# Patient Record
Sex: Male | Born: 1955
Health system: Southern US, Community
[De-identification: ages and names within clinical notes are randomized; demographics above are authoritative.]

## PROBLEM LIST (undated history)

## (undated) DIAGNOSIS — E039 Hypothyroidism, unspecified: Secondary | ICD-10-CM

## (undated) DIAGNOSIS — I10 Essential (primary) hypertension: Secondary | ICD-10-CM

## (undated) DIAGNOSIS — N200 Calculus of kidney: Secondary | ICD-10-CM

## (undated) DIAGNOSIS — R51 Headache: Secondary | ICD-10-CM

## (undated) DIAGNOSIS — G238 Other specified degenerative diseases of basal ganglia: Secondary | ICD-10-CM

## (undated) DIAGNOSIS — G63 Polyneuropathy in diseases classified elsewhere: Secondary | ICD-10-CM

## (undated) HISTORY — PX: MANDIBLE FRACTURE SURGERY: SHX706

## (undated) HISTORY — PX: CERVICAL SPINE SURGERY: SHX589

---

## 1980-08-07 HISTORY — PX: NASAL SINUS SURGERY: SHX719

## 1998-04-13 ENCOUNTER — Encounter (HOSPITAL_COMMUNITY): Admission: RE | Admit: 1998-04-13 | Discharge: 1998-07-12 | Payer: Self-pay | Admitting: *Deleted

## 1998-10-17 ENCOUNTER — Ambulatory Visit (HOSPITAL_COMMUNITY): Admission: RE | Admit: 1998-10-17 | Discharge: 1998-10-17 | Payer: Self-pay | Admitting: Neurosurgery

## 1998-10-17 ENCOUNTER — Encounter: Payer: Self-pay | Admitting: Neurosurgery

## 1999-10-28 ENCOUNTER — Encounter: Payer: Self-pay | Admitting: Neurosurgery

## 1999-10-28 ENCOUNTER — Ambulatory Visit (HOSPITAL_COMMUNITY): Admission: RE | Admit: 1999-10-28 | Discharge: 1999-10-28 | Payer: Self-pay | Admitting: Neurosurgery

## 2000-10-04 ENCOUNTER — Encounter: Payer: Self-pay | Admitting: Allergy and Immunology

## 2000-10-04 ENCOUNTER — Encounter: Admission: RE | Admit: 2000-10-04 | Discharge: 2000-10-04 | Payer: Self-pay | Admitting: *Deleted

## 2008-03-10 ENCOUNTER — Emergency Department (HOSPITAL_COMMUNITY): Admission: EM | Admit: 2008-03-10 | Discharge: 2008-03-10 | Payer: Self-pay | Admitting: Emergency Medicine

## 2008-03-10 ENCOUNTER — Encounter: Payer: Self-pay | Admitting: Orthopedic Surgery

## 2008-04-06 ENCOUNTER — Ambulatory Visit: Payer: Self-pay | Admitting: Orthopedic Surgery

## 2008-04-06 DIAGNOSIS — M7710 Lateral epicondylitis, unspecified elbow: Secondary | ICD-10-CM | POA: Insufficient documentation

## 2008-04-14 ENCOUNTER — Ambulatory Visit: Payer: Self-pay | Admitting: Orthopedic Surgery

## 2008-04-14 DIAGNOSIS — D492 Neoplasm of unspecified behavior of bone, soft tissue, and skin: Secondary | ICD-10-CM | POA: Insufficient documentation

## 2008-04-17 ENCOUNTER — Ambulatory Visit (HOSPITAL_COMMUNITY): Admission: RE | Admit: 2008-04-17 | Discharge: 2008-04-17 | Payer: Self-pay | Admitting: Orthopedic Surgery

## 2008-04-17 ENCOUNTER — Ambulatory Visit: Payer: Self-pay | Admitting: Orthopedic Surgery

## 2008-04-17 ENCOUNTER — Encounter: Payer: Self-pay | Admitting: Orthopedic Surgery

## 2008-05-04 ENCOUNTER — Ambulatory Visit: Payer: Self-pay | Admitting: Orthopedic Surgery

## 2008-05-13 ENCOUNTER — Telehealth: Payer: Self-pay | Admitting: Orthopedic Surgery

## 2009-05-14 ENCOUNTER — Encounter: Admission: RE | Admit: 2009-05-14 | Discharge: 2009-05-14 | Payer: Self-pay | Admitting: Neurosurgery

## 2010-10-06 ENCOUNTER — Emergency Department (HOSPITAL_COMMUNITY): Payer: BC Managed Care – PPO

## 2010-10-06 ENCOUNTER — Emergency Department (HOSPITAL_COMMUNITY)
Admission: EM | Admit: 2010-10-06 | Discharge: 2010-10-07 | Disposition: A | Discharge status: Home / Self Care | Payer: BC Managed Care – PPO | Attending: Emergency Medicine | Admitting: Emergency Medicine

## 2010-10-06 DIAGNOSIS — R079 Chest pain, unspecified: Secondary | ICD-10-CM | POA: Insufficient documentation

## 2010-10-06 DIAGNOSIS — R110 Nausea: Secondary | ICD-10-CM | POA: Insufficient documentation

## 2010-10-06 DIAGNOSIS — R42 Dizziness and giddiness: Secondary | ICD-10-CM | POA: Insufficient documentation

## 2010-10-06 DIAGNOSIS — I10 Essential (primary) hypertension: Secondary | ICD-10-CM | POA: Insufficient documentation

## 2010-10-06 DIAGNOSIS — E039 Hypothyroidism, unspecified: Secondary | ICD-10-CM | POA: Insufficient documentation

## 2010-10-06 DIAGNOSIS — Z79899 Other long term (current) drug therapy: Secondary | ICD-10-CM | POA: Insufficient documentation

## 2010-10-06 LAB — BASIC METABOLIC PANEL
CO2: 30 mEq/L (ref 19–32)
Chloride: 106 mEq/L (ref 96–112)
Creatinine, Ser: 1.21 mg/dL (ref 0.4–1.5)
GFR calc Af Amer: >60 mL/min (ref >60)
Glucose, Bld: 89 mg/dL (ref 70–99)
Sodium: 142 mEq/L (ref 135–145)

## 2010-10-06 LAB — CBC
HCT: 46.2 % (ref 39.0–52.0)
Hemoglobin: 15.8 g/dL (ref 13.0–17.0)
MCH: 30.1 pg (ref 26.0–34.0)
MCHC: 34.2 g/dL (ref 30.0–36.0)

## 2010-10-06 LAB — DIFFERENTIAL
Lymphocytes Relative: 28 % (ref 12–46)
Monocytes Absolute: 0.9 10*3/uL (ref 0.1–1.0)
Monocytes Relative: 10 % (ref 3–12)
Neutro Abs: 5.6 10*3/uL (ref 1.7–7.7)

## 2010-10-06 LAB — POCT CARDIAC MARKERS

## 2010-10-07 LAB — URINALYSIS, ROUTINE W REFLEX MICROSCOPIC
Bilirubin Urine: NEGATIVE
Hgb urine dipstick: NEGATIVE
Ketones, ur: NEGATIVE mg/dL
Protein, ur: NEGATIVE mg/dL
Urine Glucose, Fasting: NEGATIVE mg/dL

## 2010-12-20 NOTE — H&P (Signed)
Marc [name], Marc [name]              ACCOUNT NO.:  [id number]   MEDICAL RECORD NO.:  [id number]          PATIENT TYPE:  AMB   LOCATION:  DAY                           FACILITY:  [facility]   PHYSICIAN:  Vickki Hearing, M.D.DATE OF BIRTH:  1956-04-02   DATE OF ADMISSION:  04/16/2008  DATE OF DISCHARGE:  LH                              HISTORY & PHYSICAL   CHIEF COMPLAINT:  Mass right thumb.   HISTORY:  This patient is referred to [place] from [facility]  with a right thumb lesion.   The patient reports that he was scraping with a metal file of some sort  and some particles of metal got in his thumb about 3-4 months ago.  Did  not pay much attention to it, but overtime, it has become tender,  swollen and is getting larger.   Radiographs show soft tissue swelling with no bony involvement.   MEDICATION LIST:  Noted for:  1. Synthroid 112 mcg.  2. Norvasc 5 mg.  3. Lyrica 75 mg.   ALLERGIES:  No allergies.   MEDICAL HISTORY:  1. Hypertension.  2. Thyroid disease.   SURGICAL HISTORY:  The patient had sinus surgery C5-6 fusion, surgery on  his jaw.   FAMILY HISTORY:  Negative.   SOCIAL HISTORY:  Married.  He is a Cytogeneticist.   SOCIAL RISK FACTORS:  No history of smoking.  Alcoholic beverage use:  None.  Caffeine:  One drink per day.   REVIEW OF SYSTEMS:  Currently is negative for General, Cardiac,  Respiratory, GI, GU, Neuro, Musculoskeletal, Endocrine, other than  stated, Psych, Derm, Immunologic, Lymphatic and ENT.   PHYSICAL EXAMINATION:  GENERAL:  Exam shows a well-developed, well-  nourished male, grooming and hygiene are intact.  VITAL SIGNS:  Stable.  VASCULAR:  He had a normal peripheral vascular system.  No swelling and  normal temperature and pulses.  Good capillary refill noted in the  thumb.  LYMPH NODES:  Negative in the axilla.  MUSCULOSKELETAL EXAM:  Gait and station were normal.  EXTREMITIES:  On inspection of the volar aspect of the right  thumb, more  towards the radial side, there is a soft tissue mass 1 x 1.5 cm which is  tender and appears to be lobular.  His IP joint motion is normal.  His  thumb is stable at the MP and IP joints.  Muscle tone is normal.  There  is no atrophy.  SKIN:  Lesion as described.  NEUROLOGICAL:  He is well coordinated, good reflexes, normal sensation,  and he is oriented x3 with normal mood.   IMPRESSION:  Neoplasm benign (239.2).   PLAN:  X-rays were reviewed.  Soft tissue swelling as stated.  Plan is  for excisional biopsy.  Will send a path, most likely benign granuloma.   PROCEDURE PLANNED:  Excisional biopsy right thumb.      Vickki Hearing, M.D.  Electronically Signed     SEH/MEDQ  D:  04/16/2008  T:  04/16/2008  Job:  [id number]   cc:   [facility]

## 2010-12-20 NOTE — Op Note (Signed)
NAMELINKEN, MCGLOTHEN              ACCOUNT NO.:  [id number]   MEDICAL RECORD NO.:  [id number]          PATIENT TYPE:  AMB   LOCATION:  DAY                           FACILITY:  [facility]   PHYSICIAN:  Vickki Hearing, M.D.DATE OF BIRTH:  1955-12-21   DATE OF PROCEDURE:  DATE OF DISCHARGE:                               OPERATIVE REPORT   HISTORY:  This is a 55 year old male presented with a right thumb mass  of about 3 months duration.  The patient was scraping metal, some of the  particles of metal got into his thumb.  He did not pay much attention to  it initially, but over time, it became large, swollen, tender, and was  getting larger.  We took x-rays, they were negative.   PREOPERATIVE DIAGNOSIS:  Our presumptive diagnosis is granuloma/mass  right thumb.   POSTOPERATIVE DIAGNOSIS:  Our presumptive diagnosis is granuloma/mass  right thumb.   PROCEDURE:  Excisional biopsy, right thumb.  There was a specimen, was a  1 x 1 cm globular white mass appeared to have no fluid in it, but a  cheese-like material inside.  We did it under regional Bier block.   SURGEON:  Vickki Hearing, M.D.   ASSISTANTS:  None.   BLOOD LOSS:  Zero.   COMPLICATIONS:  None.   COUNTS:  Correct.   OPERATIVE PROCEDURE:  The patient was identified in the preop area,  surgical site marking in the right thumb.  The patient and surgeon, the  patient given vancomycin due to amoxicillin allergy.  The patient taken  to surgery, given a Bier block successfully.   A longitudinal incision was made over the mass.  Blunt dissection was  carried out around the mass and mass was removed as an en bloc with good  soft tissue margin.  There appeared to be a pseudocapsule around the  mass.   Wound was irrigated, closed with 3-0 nylon horizontal mattress sutures.  We injected 10 mL Marcaine plain.  Tourniquet was released.  Color of  the fingers was pink.  Capillary refill was excellent.  The patient was  taken  to recovery room im good condition   Discharged on Norco 5/325 mg 1 every 4 hours p.r.n. for pain #40, no  refills.  The patient already has scheduled postoperative appointment.      Vickki Hearing, M.D.  Electronically Signed     SEH/MEDQ  D:  04/17/2008  T:  04/17/2008  Job:  [id number]

## 2011-05-10 LAB — CBC
MCHC: 34.8
MCV: 88.4
Platelets: 169
RBC: 5.4

## 2011-05-10 LAB — BASIC METABOLIC PANEL
BUN: 15
CO2: 25
Chloride: 109
Creatinine, Ser: 1.06
Glucose, Bld: 93

## 2011-06-26 ENCOUNTER — Encounter: Payer: Self-pay | Admitting: *Deleted

## 2011-06-26 ENCOUNTER — Emergency Department (HOSPITAL_COMMUNITY)
Admission: EM | Admit: 2011-06-26 | Discharge: 2011-06-26 | Disposition: A | Discharge status: Home / Self Care | Payer: BC Managed Care – PPO | Attending: Emergency Medicine | Admitting: Emergency Medicine

## 2011-06-26 ENCOUNTER — Emergency Department (HOSPITAL_COMMUNITY): Payer: BC Managed Care – PPO

## 2011-06-26 DIAGNOSIS — S90129A Contusion of unspecified lesser toe(s) without damage to nail, initial encounter: Secondary | ICD-10-CM | POA: Insufficient documentation

## 2011-06-26 DIAGNOSIS — IMO0002 Reserved for concepts with insufficient information to code with codable children: Secondary | ICD-10-CM | POA: Insufficient documentation

## 2011-06-26 DIAGNOSIS — S90221A Contusion of right lesser toe(s) with damage to nail, initial encounter: Secondary | ICD-10-CM

## 2011-06-26 MED ORDER — BACITRACIN ZINC 500 UNIT/GM EX OINT
TOPICAL_OINTMENT | Freq: Once | CUTANEOUS | Status: AC
  Administered 2011-06-26: 20:00:00 via TOPICAL
  Filled 2011-06-26: qty 0.9

## 2011-06-26 MED ORDER — BUPIVACAINE HCL (PF) 0.5 % IJ SOLN
INTRAMUSCULAR | Status: AC
  Filled 2011-06-26: qty 30

## 2011-06-26 MED ORDER — HYDROCODONE-ACETAMINOPHEN 5-325 MG PO TABS: ORAL_TABLET | ORAL | Status: DC

## 2011-06-26 NOTE — ED Provider Notes (Signed)
Medical screening examination/treatment/procedure(s) were performed by non-physician practitioner and as supervising physician I was immediately available for consultation/collaboration.  Addysen Louth, MD 06/26/11 2125 

## 2011-06-26 NOTE — ED Provider Notes (Signed)
History     CSN: [id number] Arrival date & time: 06/26/2011  5:57 PM   First MD Initiated Contact with Patient 06/26/11 1759      Chief Complaint  Patient presents with  . Foot Injury    (Consider location/radiation/quality/duration/timing/severity/associated sxs/prior treatment) Patient is a 55 y.o. male presenting with foot injury. The history is provided by the patient. No language interpreter was used.  Foot Injury  The incident occurred 2 days ago. The incident occurred at home. The injury mechanism was a direct blow. The pain is present in the right toes. The quality of the pain is described as throbbing. The pain is mild. The pain has been fluctuating since onset. He reports no foreign bodies present. The symptoms are aggravated by palpation and bearing weight. He has tried nothing for the symptoms.    History reviewed. No pertinent past medical history.  Past Surgical History  Procedure Date  . Mandible fracture surgery   . Cervical spine surgery     History reviewed. No pertinent family history.  History  Substance Use Topics  . Smoking status: Former Games developer  . Smokeless tobacco: Not on file  . Alcohol Use: No      Review of Systems  Musculoskeletal:       Toe injury  All other systems reviewed and are negative.    Allergies  Review of patient's allergies indicates no known allergies.  Home Medications  No current outpatient prescriptions on file.  BP 134/88  Pulse 85  Temp(Src) 98.2 F (36.8 C) (Oral)  Resp 16  Ht 5\' 10"  (1.778 m)  Wt 185 lb (83.915 kg)  BMI 26.54 kg/m2  SpO2 100%  Physical Exam  Nursing note and vitals reviewed. Constitutional: He is oriented to person, place, and time. He appears well-developed and well-nourished. No distress.  HENT:  Head: Normocephalic and atraumatic.  Eyes: EOM are normal.  Neck: Normal range of motion.  Cardiovascular: Normal rate, regular rhythm, normal heart sounds and intact distal pulses.     Pulmonary/Chest: Effort normal and breath sounds normal. No respiratory distress.  Abdominal: Soft. He exhibits no distension. There is no tenderness.  Musculoskeletal: He exhibits tenderness.       Right foot: He exhibits tenderness, bony tenderness and swelling. He exhibits normal capillary refill, no crepitus and no deformity.       Feet:  Neurological: He is alert and oriented to person, place, and time.  Skin: Skin is warm and dry. He is not diaphoretic.  Psychiatric: He has a normal mood and affect. Judgment normal.    ED Course  NAIL REMOVAL Date/Time: 06/26/2011 7:40 PM Performed by: Worthy Rancher Authorized by: Worthy Rancher Consent: Verbal consent obtained. Written consent not obtained. Risks and benefits: risks, benefits and alternatives were discussed Consent given by: patient Patient understanding: patient states understanding of the procedure being performed Imaging studies: imaging studies available Required items: required blood products, implants, devices, and special equipment available Patient identity confirmed: verbally with patient Time out: Immediately prior to procedure a "time out" was called to verify the correct patient, procedure, equipment, support staff and site/side marked as required. Location: right foot Location details: right big toe Anesthesia: digital block Local anesthetic: bupivacaine 0.5% without epinephrine Anesthetic total: 8 ml Patient sedated: no Preparation: skin prepped with Betadine and sterile field established Amount removed: complete Wedge excision of skin of nail fold: no Nail bed sutured: no Nail matrix removed: none Removed nail replaced and anchored: no Dressing: gauze roll,  antibiotic ointment, non-adhesive packing strip and dressing applied Patient tolerance: Patient tolerated the procedure well with no immediate complications.   (including critical care time)  Labs Reviewed - No data to display No results  found.   No diagnosis found.    MDM           Worthy Rancher, PA 06/26/11 2009

## 2011-06-26 NOTE — ED Notes (Signed)
Dry dsg. Applied to right great toe with kling

## 2011-06-26 NOTE — ED Notes (Signed)
Pt states that he dropped a piece of machinery on his right great toe on Saturday. C/o bruising to his right great toe.

## 2013-10-16 ENCOUNTER — Emergency Department (HOSPITAL_COMMUNITY)
Admission: EM | Admit: 2013-10-16 | Discharge: 2013-10-16 | Disposition: A | Discharge status: Home / Self Care | Payer: BC Managed Care – PPO | Attending: Emergency Medicine | Admitting: Emergency Medicine

## 2013-10-16 ENCOUNTER — Encounter (HOSPITAL_COMMUNITY): Payer: Self-pay | Admitting: Emergency Medicine

## 2013-10-16 ENCOUNTER — Emergency Department (HOSPITAL_COMMUNITY): Payer: BC Managed Care – PPO

## 2013-10-16 DIAGNOSIS — N201 Calculus of ureter: Secondary | ICD-10-CM | POA: Insufficient documentation

## 2013-10-16 DIAGNOSIS — N23 Unspecified renal colic: Secondary | ICD-10-CM

## 2013-10-16 DIAGNOSIS — I10 Essential (primary) hypertension: Secondary | ICD-10-CM | POA: Insufficient documentation

## 2013-10-16 DIAGNOSIS — Z79899 Other long term (current) drug therapy: Secondary | ICD-10-CM | POA: Insufficient documentation

## 2013-10-16 DIAGNOSIS — Z87442 Personal history of urinary calculi: Secondary | ICD-10-CM | POA: Insufficient documentation

## 2013-10-16 HISTORY — DX: Calculus of kidney: N20.0

## 2013-10-16 HISTORY — DX: Essential (primary) hypertension: I10

## 2013-10-16 LAB — BASIC METABOLIC PANEL
BUN: 15 mg/dL (ref 6–23)
CHLORIDE: 106 meq/L (ref 96–112)
CO2: 26 meq/L (ref 19–32)
Calcium: 8.7 mg/dL (ref 8.4–10.5)
Creatinine, Ser: 1.47 mg/dL — ABNORMAL HIGH (ref 0.50–1.35)
GFR calc non Af Amer: 50 mL/min — ABNORMAL LOW (ref >90)
GFR, EST AFRICAN AMERICAN: 59 mL/min — AB (ref >90)
Glucose, Bld: 146 mg/dL — ABNORMAL HIGH (ref 70–99)
POTASSIUM: 4.3 meq/L (ref 3.7–5.3)
SODIUM: 143 meq/L (ref 137–147)

## 2013-10-16 LAB — CBC WITH DIFFERENTIAL/PLATELET
BASOS ABS: 0 10*3/uL (ref 0.0–0.1)
BASOS PCT: 0 % (ref 0–1)
Eosinophils Absolute: 0 10*3/uL (ref 0.0–0.7)
Eosinophils Relative: 0 % (ref 0–5)
HEMATOCRIT: 45.3 % (ref 39.0–52.0)
Hemoglobin: 15.7 g/dL (ref 13.0–17.0)
LYMPHS PCT: 5 % — AB (ref 12–46)
Lymphs Abs: 0.8 10*3/uL (ref 0.7–4.0)
MCH: 30.5 pg (ref 26.0–34.0)
MCHC: 34.7 g/dL (ref 30.0–36.0)
MCV: 88.1 fL (ref 78.0–100.0)
Monocytes Absolute: 0.9 10*3/uL (ref 0.1–1.0)
Monocytes Relative: 6 % (ref 3–12)
NEUTROS ABS: 13.4 10*3/uL — AB (ref 1.7–7.7)
NEUTROS PCT: 89 % — AB (ref 43–77)
Platelets: 155 10*3/uL (ref 150–400)
RBC: 5.14 MIL/uL (ref 4.22–5.81)
RDW: 12.6 % (ref 11.5–15.5)
WBC: 15.1 10*3/uL — AB (ref 4.0–10.5)

## 2013-10-16 LAB — URINALYSIS, ROUTINE W REFLEX MICROSCOPIC
BILIRUBIN URINE: NEGATIVE
Glucose, UA: NEGATIVE mg/dL
Ketones, ur: NEGATIVE mg/dL
Leukocytes, UA: NEGATIVE
NITRITE: NEGATIVE
PH: 5.5 (ref 5.0–8.0)
Protein, ur: NEGATIVE mg/dL
UROBILINOGEN UA: 0.2 mg/dL (ref 0.0–1.0)

## 2013-10-16 LAB — URINE MICROSCOPIC-ADD ON

## 2013-10-16 MED ORDER — HYDROMORPHONE HCL PF 2 MG/ML IJ SOLN
2.0000 mg | Freq: Once | INTRAMUSCULAR | Status: AC
  Administered 2013-10-16: 2 mg via INTRAVENOUS
  Filled 2013-10-16: qty 1

## 2013-10-16 MED ORDER — METOCLOPRAMIDE HCL 5 MG/ML IJ SOLN
10.0000 mg | Freq: Once | INTRAMUSCULAR | Status: AC
  Administered 2013-10-16: 10 mg via INTRAVENOUS

## 2013-10-16 MED ORDER — SODIUM CHLORIDE 0.9 % IV BOLUS (SEPSIS)
500.0000 mL | Freq: Once | INTRAVENOUS | Status: AC
Start: 2013-10-16 — End: 2013-10-16
  Administered 2013-10-16: 500 mL via INTRAVENOUS

## 2013-10-16 MED ORDER — ONDANSETRON HCL 4 MG/2ML IJ SOLN
4.0000 mg | INTRAMUSCULAR | Status: DC | PRN
  Administered 2013-10-16: 4 mg via INTRAVENOUS
  Filled 2013-10-16: qty 2

## 2013-10-16 MED ORDER — KETOROLAC TROMETHAMINE 30 MG/ML IJ SOLN
30.0000 mg | Freq: Once | INTRAMUSCULAR | Status: AC
  Administered 2013-10-16: 30 mg via INTRAVENOUS
  Filled 2013-10-16: qty 1

## 2013-10-16 MED ORDER — TAMSULOSIN HCL 0.4 MG PO CAPS: 0.4000 mg | ORAL_CAPSULE | Freq: Every day | ORAL | Status: DC

## 2013-10-16 MED ORDER — ONDANSETRON HCL 4 MG/2ML IJ SOLN
4.0000 mg | INTRAMUSCULAR | Status: AC | PRN
  Administered 2013-10-16 (×2): 4 mg via INTRAVENOUS
  Filled 2013-10-16 (×2): qty 2

## 2013-10-16 MED ORDER — ONDANSETRON 4 MG PO TBDP: ORAL_TABLET | ORAL | Status: DC

## 2013-10-16 MED ORDER — OXYCODONE-ACETAMINOPHEN 5-325 MG PO TABS: 1.0000 | ORAL_TABLET | Freq: Four times a day (QID) | ORAL | Status: DC | PRN

## 2013-10-16 MED ORDER — METOCLOPRAMIDE HCL 5 MG/ML IJ SOLN
INTRAMUSCULAR | Status: AC
  Filled 2013-10-16: qty 2

## 2013-10-16 NOTE — Discharge Instructions (Signed)
Follow up with dr. Michela Pitcher next week.  Call tomorrow for an appointment

## 2013-10-16 NOTE — ED Provider Notes (Signed)
CSN: [id number]     Arrival date & time 10/16/13  1151 History   First MD Initiated Contact with Patient 10/16/13 1400     Chief Complaint  Patient presents with  . Flank Pain     (Consider location/radiation/quality/duration/timing/severity/associated sxs/prior Treatment) Patient is a 58 y.o. male presenting with flank pain. The history is provided by the patient (the pt complains of left flank pain).  Flank Pain This is a new problem. The current episode started yesterday. The problem occurs constantly. The problem has not changed since onset.Pertinent negatives include no chest pain, no abdominal pain, no headaches and no shortness of breath. Nothing aggravates the symptoms. Nothing relieves the symptoms.    Past Medical History  Diagnosis Date  . Kidney stones   . Hypertension    Past Surgical History  Procedure Laterality Date  . Mandible fracture surgery    . Cervical spine surgery     History reviewed. No pertinent family history. History  Substance Use Topics  . Smoking status: Never Smoker   . Smokeless tobacco: Never Used  . Alcohol Use: No    Review of Systems  Constitutional: Negative for appetite change and fatigue.  HENT: Negative for congestion, ear discharge and sinus pressure.   Eyes: Negative for discharge.  Respiratory: Negative for cough and shortness of breath.   Cardiovascular: Negative for chest pain.  Gastrointestinal: Negative for abdominal pain and diarrhea.  Genitourinary: Positive for flank pain. Negative for frequency and hematuria.  Musculoskeletal: Negative for back pain.  Skin: Negative for rash.  Neurological: Negative for seizures and headaches.  Psychiatric/Behavioral: Negative for hallucinations.      Allergies  Review of patient's allergies indicates no known allergies.  Home Medications   Current Outpatient Rx  Name  Route  Sig  Dispense  Refill  . amLODipine (NORVASC) 5 MG tablet   Oral   Take 5 mg by mouth at bedtime.            Marland Kitchen ibuprofen (ADVIL,MOTRIN) 200 MG tablet   Oral   Take 800 mg by mouth daily as needed for mild pain.         Marland Kitchen levothyroxine (SYNTHROID, LEVOTHROID) 75 MCG tablet   Oral   Take 75 mcg by mouth at bedtime.           . pregabalin (LYRICA) 75 MG capsule   Oral   Take 75 mg by mouth at bedtime.          Marland Kitchen HYDROcodone-acetaminophen (NORCO/VICODIN) 5-325 MG per tablet      One tab po q 4-6 hrs prn pain         . ondansetron (ZOFRAN ODT) 4 MG disintegrating tablet      4mg  ODT q4-6 hours prn nausea/vomit   15 tablet   0   . oxyCODONE-acetaminophen (PERCOCET/ROXICET) 5-325 MG per tablet   Oral   Take 1 tablet by mouth every 6 (six) hours as needed for severe pain.   20 tablet   0   . tamsulosin (FLOMAX) 0.4 MG CAPS capsule   Oral   Take 1 capsule (0.4 mg total) by mouth daily.   5 capsule   0    BP 143/79  Pulse 70  Temp(Src) 97.6 F (36.4 C) (Oral)  Resp 16  Ht 5\' 10"  (1.778 m)  Wt 180 lb (81.647 kg)  BMI 25.83 kg/m2  SpO2 97% Physical Exam  Constitutional: He is oriented to person, place, and time. He appears well-developed.  HENT:  Head: Normocephalic.  Eyes: Conjunctivae and EOM are normal. No scleral icterus.  Neck: Neck supple. No thyromegaly present.  Cardiovascular: Normal rate and regular rhythm.  Exam reveals no gallop and no friction rub.   No murmur heard. Pulmonary/Chest: No stridor. He has no wheezes. He has no rales. He exhibits no tenderness.  Abdominal: He exhibits no distension. There is no tenderness. There is no rebound.  Genitourinary:  Tender left flank  Musculoskeletal: Normal range of motion. He exhibits no edema.  Lymphadenopathy:    He has no cervical adenopathy.  Neurological: He is oriented to person, place, and time. He exhibits normal muscle tone. Coordination normal.  Skin: No rash noted. No erythema.  Psychiatric: He has a normal mood and affect. His behavior is normal.    ED Course  Procedures (including  critical care time) Labs Review Labs Reviewed  URINALYSIS, ROUTINE W REFLEX MICROSCOPIC - Abnormal; Notable for the following:    Specific Gravity, Urine >1.030 (*)    Hgb urine dipstick LARGE (*)    All other components within normal limits  CBC WITH DIFFERENTIAL - Abnormal; Notable for the following:    WBC 15.1 (*)    Neutrophils Relative % 89 (*)    Neutro Abs 13.4 (*)    Lymphocytes Relative 5 (*)    All other components within normal limits  BASIC METABOLIC PANEL - Abnormal; Notable for the following:    Glucose, Bld 146 (*)    Creatinine, Ser 1.47 (*)    GFR calc non Af Amer 50 (*)    GFR calc Af Amer 59 (*)    All other components within normal limits  URINE MICROSCOPIC-ADD ON   Imaging Review Ct Abdomen Pelvis Wo Contrast  10/16/2013   CLINICAL DATA:  Left flank pain  EXAM: CT ABDOMEN AND PELVIS WITHOUT CONTRAST  TECHNIQUE: Multidetector CT imaging of the abdomen and pelvis was performed following the standard protocol without intravenous contrast.  COMPARISON:  None.  FINDINGS: Lung bases are clear. Liver gallbladder and bile ducts are normal. Pancreas and spleen are normal.  7 mm stone in the proximal left ureter causing moderate obstruction of left kidney with hydronephrosis and perinephric edema. 2 mm nonobstructing stone left upper pole. No calculi on the right. No renal mass.  Negative for bowel obstruction. Appendix normal. No free fluid. Negative for mass or adenopathy.  IMPRESSION: 7 mm obstructing stone proximal left ureter. 2 mm nonobstructing left upper pole calculus. No calculi on the right.   Electronically Signed   By: Franchot Gallo M.D.   On: 10/16/2013 15:20     EKG Interpretation None      MDM   Final diagnoses:  Ureteral colic  Left ureteral calculus        Maudry Diego, MD 10/16/13 2251

## 2013-10-16 NOTE — ED Notes (Addendum)
Patient given water with MD approval per request. Patient took two sips and immediately vomited. MD made aware.

## 2013-10-16 NOTE — ED Provider Notes (Signed)
CSN: [id number]     Arrival date & time 10/16/13  1151 History   First MD Initiated Contact with Patient 10/16/13 1400     Chief Complaint  Patient presents with  . Flank Pain      HPI Pt was seen at 1400. Per pt, c/o sudden onset and persistence of waxing and waning left sided flank "pain" that began this morning PTA.  Pt describes the pain as "like my last kidney stone," "sharp," "stabbing," and radiating into the left side of his abd.  Has been associated with nausea.  Denies testicular pain/swelling, no dysuria/hematuria, no abd pain, no diarrhea, no black or blood in stools, no CP/SOB.     Past Medical History  Diagnosis Date  . Kidney stones   . Hypertension    Past Surgical History  Procedure Laterality Date  . Mandible fracture surgery    . Cervical spine surgery      History  Substance Use Topics  . Smoking status: Never Smoker   . Smokeless tobacco: Never Used  . Alcohol Use: No    Review of Systems ROS: Statement: All systems negative except as marked or noted in the HPI; Constitutional: Negative for fever and chills. ; ; Eyes: Negative for eye pain, redness and discharge. ; ; ENMT: Negative for ear pain, hoarseness, nasal congestion, sinus pressure and sore throat. ; ; Cardiovascular: Negative for chest pain, palpitations, diaphoresis, dyspnea and peripheral edema. ; ; Respiratory: Negative for cough, wheezing and stridor. ; ; Gastrointestinal: Negative for nausea, vomiting, diarrhea, abdominal pain, blood in stool, hematemesis, jaundice and rectal bleeding. . ; ; Genitourinary: +flank pain. Negative for dysuria and hematuria. ; ; Genital:  No penile drainage or rash, no testicular pain or swelling, no scrotal rash or swelling.;;  Musculoskeletal: Negative for back pain and neck pain. Negative for swelling and trauma.; ; Skin: Negative for pruritus, rash, abrasions, blisters, bruising and skin lesion.; ; Neuro: Negative for headache, lightheadedness and neck stiffness.  Negative for weakness, altered level of consciousness , altered mental status, extremity weakness, paresthesias, involuntary movement, seizure and syncope.     Allergies  Review of patient's allergies indicates no known allergies.  Home Medications   Current Outpatient Rx  Name  Route  Sig  Dispense  Refill  . amLODipine (NORVASC) 5 MG tablet   Oral   Take 5 mg by mouth at bedtime.           Marland Kitchen ibuprofen (ADVIL,MOTRIN) 200 MG tablet   Oral   Take 800 mg by mouth daily as needed for mild pain.         Marland Kitchen levothyroxine (SYNTHROID, LEVOTHROID) 75 MCG tablet   Oral   Take 75 mcg by mouth at bedtime.           . pregabalin (LYRICA) 75 MG capsule   Oral   Take 75 mg by mouth at bedtime.          Marland Kitchen HYDROcodone-acetaminophen (NORCO/VICODIN) 5-325 MG per tablet      One tab po q 4-6 hrs prn pain          BP 133/83  Pulse 88  Temp(Src) 97.6 F (36.4 C) (Oral)  Resp 18  Ht 5\' 10"  (1.778 m)  Wt 180 lb (81.647 kg)  BMI 25.83 kg/m2  SpO2 99% Physical Exam 1405: Physical examination:  Nursing notes reviewed; Vital signs and O2 SAT reviewed;  Constitutional: Well developed, Well nourished, Well hydrated, uncomfortable appearing.;; Head:  Normocephalic, atraumatic; Eyes: EOMI,  PERRL, No scleral icterus; ENMT: Mouth and pharynx normal, Mucous membranes moist; Neck: Supple, Full range of motion, No lymphadenopathy; Cardiovascular: Regular rate and rhythm, No gallop; Respiratory: Breath sounds clear & equal bilaterally, No rales, rhonchi, wheezes.  Speaking full sentences with ease, Normal respiratory effort/excursion; Chest: Nontender, Movement normal; Abdomen: Soft, Nontender, Nondistended, Normal bowel sounds; Genitourinary: No CVA tenderness; Spine:  No midline CS, TS, LS tenderness. +TTP left lumbar paraspinal muscles. No rash.;;  Extremities: Pulses normal, No tenderness, No edema, No calf edema or asymmetry.; Neuro: AA&Ox3, Major CN grossly intact.  Speech clear. No gross focal  motor or sensory deficits in extremities. Climbs on and off stretcher easily by himself. Gait steady.; Skin: Color normal, Warm, Dry.   ED Course  Procedures     EKG Interpretation None      MDM  MDM Reviewed: previous chart, nursing note and vitals Interpretation: labs and CT scan    Results for orders placed during the hospital encounter of 10/16/13  URINALYSIS, ROUTINE W REFLEX MICROSCOPIC      Result Value Ref Range   Color, Urine YELLOW  YELLOW   APPearance CLEAR  CLEAR   Specific Gravity, Urine >1.030 (*) 1.005 - 1.030   pH 5.5  5.0 - 8.0   Glucose, UA NEGATIVE  NEGATIVE mg/dL   Hgb urine dipstick LARGE (*) NEGATIVE   Bilirubin Urine NEGATIVE  NEGATIVE   Ketones, ur NEGATIVE  NEGATIVE mg/dL   Protein, ur NEGATIVE  NEGATIVE mg/dL   Urobilinogen, UA 0.2  0.0 - 1.0 mg/dL   Nitrite NEGATIVE  NEGATIVE   Leukocytes, UA NEGATIVE  NEGATIVE  URINE MICROSCOPIC-ADD ON      Result Value Ref Range   Squamous Epithelial / LPF RARE  RARE   RBC / HPF 3-6  <3 RBC/hpf   Ct Abdomen Pelvis Wo Contrast 10/16/2013   CLINICAL DATA:  Left flank pain  EXAM: CT ABDOMEN AND PELVIS WITHOUT CONTRAST  TECHNIQUE: Multidetector CT imaging of the abdomen and pelvis was performed following the standard protocol without intravenous contrast.  COMPARISON:  None.  FINDINGS: Lung bases are clear. Liver gallbladder and bile ducts are normal. Pancreas and spleen are normal.  7 mm stone in the proximal left ureter causing moderate obstruction of left kidney with hydronephrosis and perinephric edema. 2 mm nonobstructing stone left upper pole. No calculi on the right. No renal mass.  Negative for bowel obstruction. Appendix normal. No free fluid. Negative for mass or adenopathy.  IMPRESSION: 7 mm obstructing stone proximal left ureter. 2 mm nonobstructing left upper pole calculus. No calculi on the right.   Electronically Signed   By: Franchot Gallo M.D.   On: 10/16/2013 15:20    1615:  Pt states his pain is  improved but he continues nauseated; will re-dose IV zofran. Dx and testing d/w pt and family.  Questions answered.  Verb understanding. Sign out to Dr. Roderic Palau.    Alfonzo Feller, DO 10/16/13 [phone]

## 2013-10-16 NOTE — ED Notes (Signed)
In to re-assess patient. Patient with dry-heaving. MD aware. Verbal order for 10 mg Reglan obtained.

## 2013-10-16 NOTE — ED Notes (Signed)
Patient c/o left flank pain that started one hour ago. Per patient pain radiates into groin. Per patient pain started after voiding. Denies any pain with urination or blood in urine. Patient reports taking Aleve with no relief. Patient reports past hx of kidney stones in which the pain felt the same.

## 2013-10-17 ENCOUNTER — Encounter (HOSPITAL_COMMUNITY): Payer: Self-pay | Admitting: Pharmacy Technician

## 2013-10-17 ENCOUNTER — Encounter (HOSPITAL_COMMUNITY): Payer: Self-pay | Admitting: *Deleted

## 2013-10-17 ENCOUNTER — Other Ambulatory Visit: Payer: Self-pay | Admitting: Urology

## 2013-10-20 ENCOUNTER — Encounter (HOSPITAL_COMMUNITY)
Admission: RE | Disposition: A | Discharge status: Home / Self Care | Payer: Self-pay | Source: Ambulatory Visit | Attending: Urology

## 2013-10-20 ENCOUNTER — Ambulatory Visit (HOSPITAL_COMMUNITY)
Admission: RE | Admit: 2013-10-20 | Discharge: 2013-10-20 | Disposition: A | Discharge status: Home / Self Care | Payer: BC Managed Care – PPO | Source: Ambulatory Visit | Attending: Urology | Admitting: Urology

## 2013-10-20 ENCOUNTER — Ambulatory Visit (HOSPITAL_COMMUNITY): Payer: BC Managed Care – PPO

## 2013-10-20 ENCOUNTER — Encounter (HOSPITAL_COMMUNITY): Payer: Self-pay | Admitting: *Deleted

## 2013-10-20 DIAGNOSIS — N201 Calculus of ureter: Secondary | ICD-10-CM

## 2013-10-20 DIAGNOSIS — I10 Essential (primary) hypertension: Secondary | ICD-10-CM | POA: Insufficient documentation

## 2013-10-20 DIAGNOSIS — Z79899 Other long term (current) drug therapy: Secondary | ICD-10-CM | POA: Insufficient documentation

## 2013-10-20 DIAGNOSIS — E039 Hypothyroidism, unspecified: Secondary | ICD-10-CM | POA: Insufficient documentation

## 2013-10-20 DIAGNOSIS — N200 Calculus of kidney: Secondary | ICD-10-CM | POA: Insufficient documentation

## 2013-10-20 HISTORY — DX: Other specified degenerative diseases of basal ganglia: G23.8

## 2013-10-20 HISTORY — DX: Hypothyroidism, unspecified: E03.9

## 2013-10-20 HISTORY — DX: Polyneuropathy in diseases classified elsewhere: G63

## 2013-10-20 HISTORY — DX: Headache: R51

## 2013-10-20 SURGERY — LITHOTRIPSY, ESWL
Anesthesia: LOCAL | Laterality: Left

## 2013-10-20 MED ORDER — DIPHENHYDRAMINE HCL 25 MG PO CAPS
25.0000 mg | ORAL_CAPSULE | ORAL | Status: AC
  Administered 2013-10-20: 25 mg via ORAL
  Filled 2013-10-20: qty 1

## 2013-10-20 MED ORDER — CIPROFLOXACIN HCL 500 MG PO TABS
500.0000 mg | ORAL_TABLET | ORAL | Status: AC
  Administered 2013-10-20: 500 mg via ORAL
  Filled 2013-10-20: qty 1

## 2013-10-20 MED ORDER — DIAZEPAM 5 MG PO TABS
10.0000 mg | ORAL_TABLET | ORAL | Status: AC
  Administered 2013-10-20: 10 mg via ORAL
  Filled 2013-10-20: qty 2

## 2013-10-20 MED ORDER — DEXTROSE-NACL 5-0.45 % IV SOLN
INTRAVENOUS | Status: DC
  Administered 2013-10-20: 14:00:00 via INTRAVENOUS

## 2013-10-20 NOTE — Discharge Instructions (Signed)
See [facility] discharge instructions in chart.  

## 2013-10-20 NOTE — Op Note (Signed)
See [facility] OP note scanned into chart. 

## 2013-10-20 NOTE — H&P (Signed)
History of Present Illness   Mr Mcglory Rush Landmark) presents today as a referral from the [place] for newly diagnosed 7 mm left proximal ureteral stone. Rush Landmark is currently 58 years of age. Twenty-five years ago he was hospitalized for an acute stone event with spontaneous passage. He has had no recurrences since then. Yesterday, he developed sudden onset of fairly classic left-sided renal colic with nausea and emesis. CT scan showed a partially obstructing 7 mm stone in his proximal ureter just beneath the renal pelvic junction. I have reviewed those images. The patient's stone appears to be markedly dense with Hounsfield units around 1200. His pain has eased, but he continues to have soreness along with some lingering nausea. No gross hematuria or significant voiding complaints.      Past Medical History Problems  1. History of hypertension (V12.59) 2. History of hypothyroidism (V12.29) 3. History of renal calculi (V13.01)  Surgical History Problems  1. History of Back Surgery 2. History of Jaw Surgery Repair 3. History of Sinus Surgery  Current Meds 1. AmLODIPine Besylate 5 MG Oral Tablet;  Therapy: (Recorded:13Mar2015) to Recorded 2. Ibuprofen 200 MG Oral Tablet;  Therapy: (Recorded:13Mar2015) to Recorded 3. Levothyroxine Sodium TABS;  Therapy: (Recorded:13Mar2015) to Recorded 4. Lyrica 75 MG Oral Capsule;  Therapy: (Recorded:13Mar2015) to Recorded  Allergies Medication  1. No Known Drug Allergies  Family History Problems  1. Family history of hepatic cirrhosis (V18.59) : Father  Social History Problems    Denied: History of Alcohol use   Caffeine use (V49.89)   4   Married   Never a smoker   Retired  Review of Systems Genitourinary, constitutional, skin, eye, otolaryngeal, hematologic/lymphatic, cardiovascular, pulmonary, endocrine, musculoskeletal, gastrointestinal, neurological and psychiatric system(s) were reviewed and pertinent findings if present are  noted.  Genitourinary: nocturia, but no hematuria.  Gastrointestinal: nausea, vomiting, flank pain and abdominal pain.  Musculoskeletal: back pain and joint pain.    Vitals Vital Signs [Data Includes: Last 1 Day]  Recorded: [id number] 10:35AM  Height: 5 ft 10 in Weight: 180 lb  BMI Calculated: 25.83 BSA Calculated: 2 Blood Pressure: 142 / 79 Temperature: 97.9 F Heart Rate: 80  Physical Exam Constitutional: Well nourished and well developed . No acute distress.  ENT:. The ears and nose are normal in appearance.  Neck: The appearance of the neck is normal and no neck mass is present.  Pulmonary: No respiratory distress and normal respiratory rhythm and effort.  Cardiovascular: Heart rate and rhythm are normal . No peripheral edema.  Abdomen: The abdomen is soft and nontender. No masses are palpated. No CVA tenderness. No hernias are palpable. No hepatosplenomegaly noted.  Skin: Normal skin turgor, no visible rash and no visible skin lesions.  Neuro/Psych:. Mood and affect are appropriate.    Results/Data Urine [Data Includes: Last 1 Day]   [id number]  COLOR YELLOW   APPEARANCE CLEAR   SPECIFIC GRAVITY 1.025   pH 6.0   GLUCOSE 100 mg/dL  BILIRUBIN NEG   KETONE NEG mg/dL  BLOOD LARGE   PROTEIN NEG mg/dL  UROBILINOGEN 0.2 mg/dL  NITRITE NEG   LEUKOCYTE ESTERASE NEG   SQUAMOUS EPITHELIAL/HPF NONE SEEN   WBC 0-2 WBC/hpf  RBC 11-20 RBC/hpf  BACTERIA FEW   CRYSTALS NONE SEEN   CASTS NONE SEEN    Assessment Assessed  1. Nephrolithiasis (592.0) 2. Ureteral calculus (592.1)  Plan Health Maintenance  1. UA With REFLEX; [Do Not Release]; Status:Resulted - Requires Verification;   Done:  [id number] 10:13AM Nephrolithiasis, Ureteral  calculus  2. Follow-up Schedule Surgery Office  Follow-up  Status: Hold For - Appointment   Requested for: [id number] 3. KUB; Status:Resulted - Requires Verification;   Done: [id number] 12:00AM  Discussion/Summary   Bill underwent KUB imaging  today. He clearly has a 6 x 7 mm stone still in his proximal left ureter. There does not appear to have been a significant degree of migration since his recent CT. The stone is dense, but not markedly so on KUB imaging. We discussed 3 distinct options today. This included attempts at spontaneous passage. He was told that his chance of passing the stone is in the 30% to 40% range. It can take anywhere from a few days to several weeks. Other options would include ESWL in situ versus ureteroscopy. The risks, benefits, and variable success rates of these procedures were discussed. He is most interested in ESWL as soon as possible. He understands that given the size of the stone, its location, and density that the success rate for that as a mono treatment is probably around 60%. He is told that over the weekend if he does get into severe trouble with unrelenting pain, then the only option would really be a double J stent placement to alleviate the obstruction before embarking on more definitive management. He is interested in trying to find an opening for next weeks' schedule if at all possible for lithotripsy.   cc: Gar Ponto, MD    Signatures Electronically signed by : Rana Snare, M.D.; Oct 19 2013  2:53PM EST

## 2013-10-20 NOTE — Interval H&P Note (Signed)
History and Physical Interval Note:  10/20/2013 4:27 PM  [name]  has presented today for surgery, with the diagnosis of Left Proximal Ureteral Stone  The various methods of treatment have been discussed with the patient and family. After consideration of risks, benefits and other options for treatment, the patient has consented to  Procedure([name]): LEFT EXTRACORPOREAL SHOCK WAVE LITHOTRIPSY (ESWL) (Left) as a surgical intervention .  The patient'[name] history has been reviewed, patient examined, no change in status, stable for surgery.  I have reviewed the patient'[name] chart and labs.  Questions were answered to the patient'[name] satisfaction.     Marc [name]

## 2013-10-20 NOTE — Progress Notes (Signed)
Post lithotripsy, no c/o pain. Red splotchy patches to left side. Pt drowsy, but ambulated to bathroom and voided large amt.blood tinged urine.  Discharge instructions explained and went over with wife.  No questions or concerns.  Iv site d/c, pressure dressing applied.  No prescriptions were left by MD, pt wife states she has meds at home and MD states ok to use.  Pt d/c home with wife, VSS, afebrile.

## 2015-04-29 IMAGING — CT CT ABD-PELV W/O CM
2 of 3 series · 17 of 46 positions shown, 19 images · non-contrast
Comparison: None.

CLINICAL DATA: Left flank pain

EXAM:
CT ABDOMEN AND PELVIS WITHOUT CONTRAST
TECHNIQUE: Multidetector CT imaging of the abdomen and pelvis was performed
following the standard protocol without intravenous contrast.

[Series 2: standard/full over (age)lbs 5.0 · axial · 0.74mm/px · z∈[-514,-109]mm · 14 of 94 slices shown, 16 images]
[im 7/94  soft-tissue]
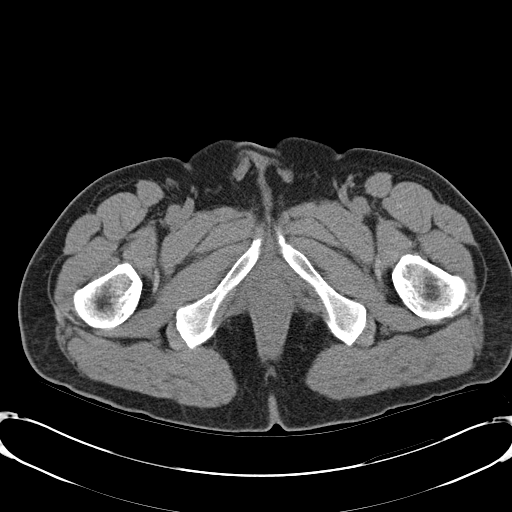
[im 7/94  bone]
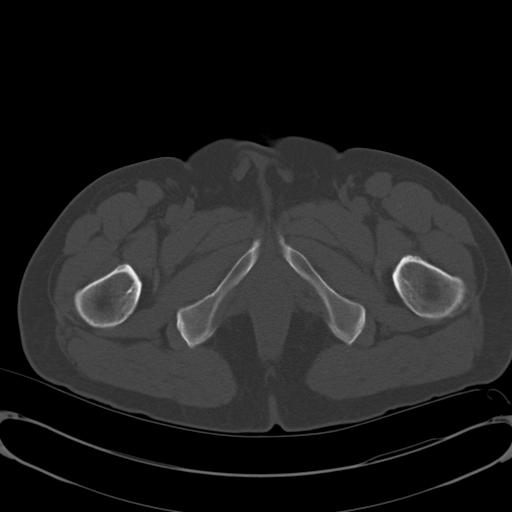
[im 13/94  soft-tissue]
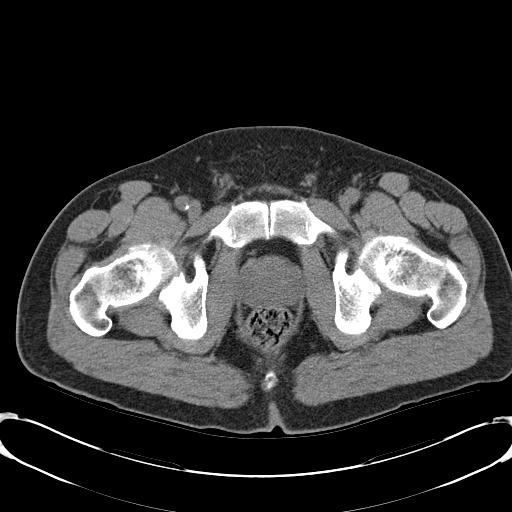
[im 19/94  soft-tissue]
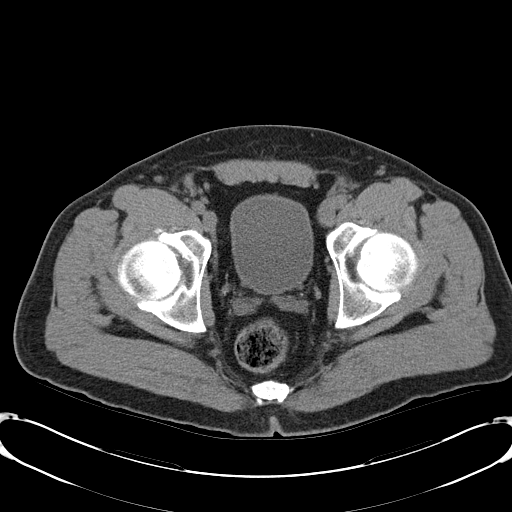
[im 25/94  soft-tissue]
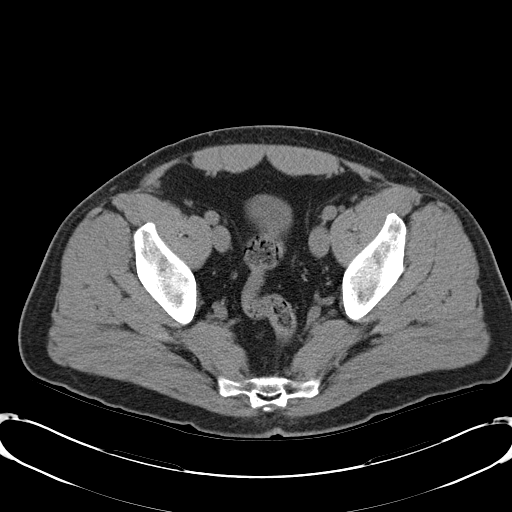
[im 31/94  soft-tissue]
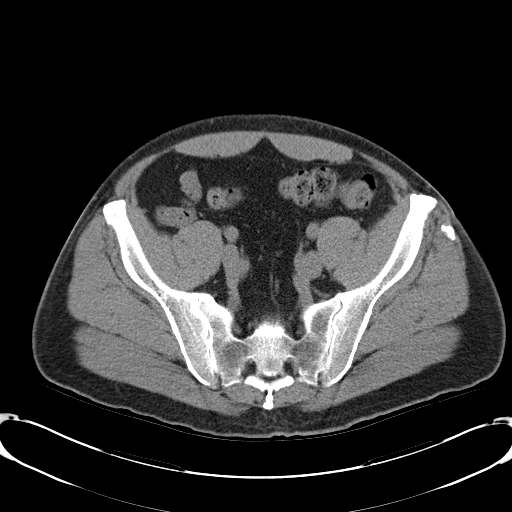
[im 37/94  soft-tissue]
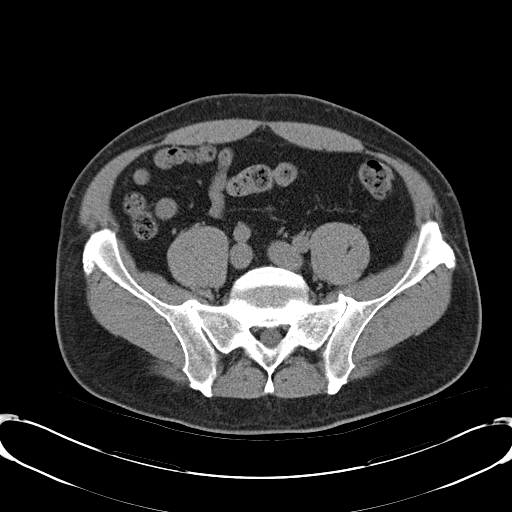
[im 43/94  soft-tissue]
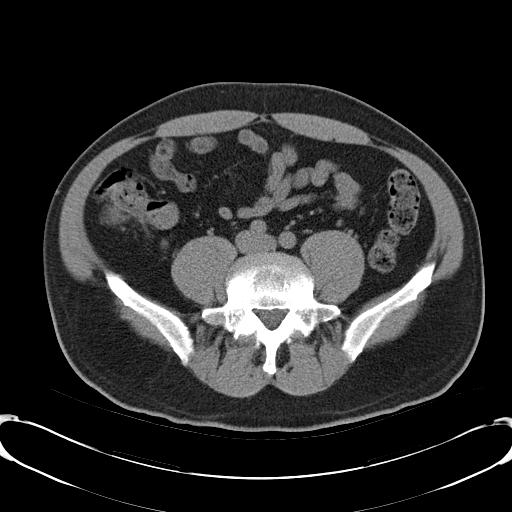
[im 52/94  soft-tissue]
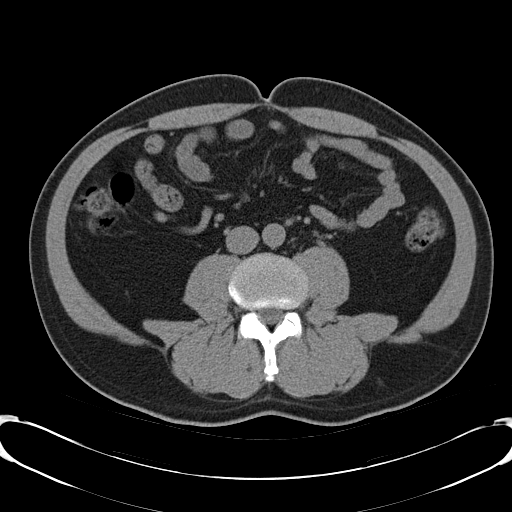
[im 58/94  soft-tissue]
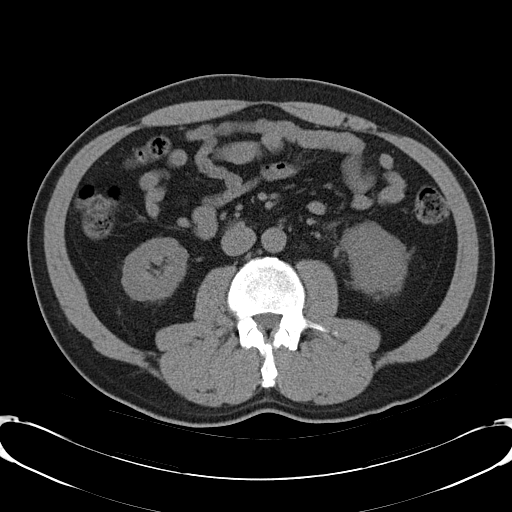
[im 58/94  bone]
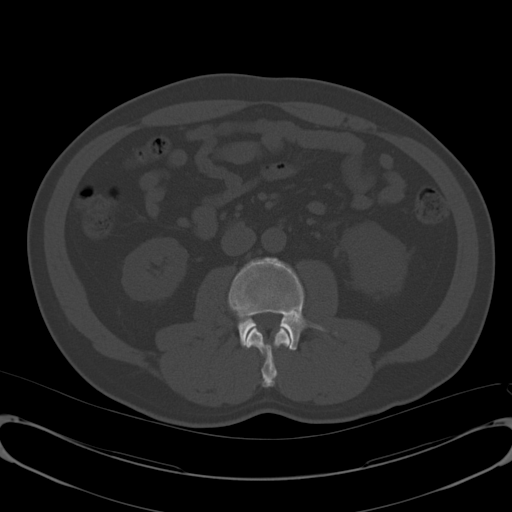
[im 64/94  soft-tissue]
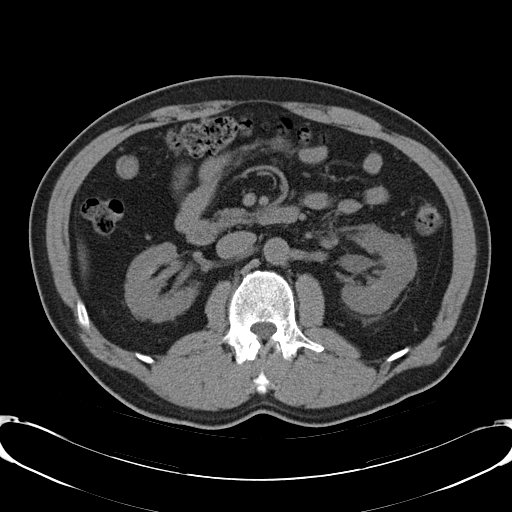
[im 70/94  soft-tissue]
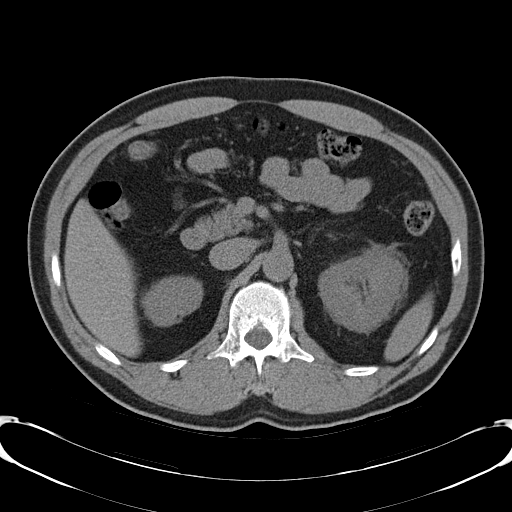
[im 76/94  soft-tissue]
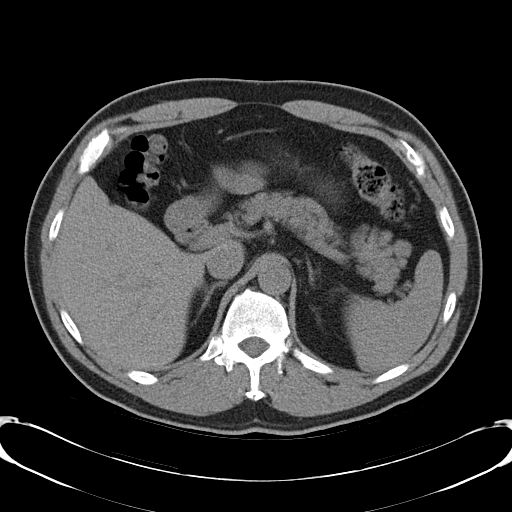
[im 82/94  soft-tissue]
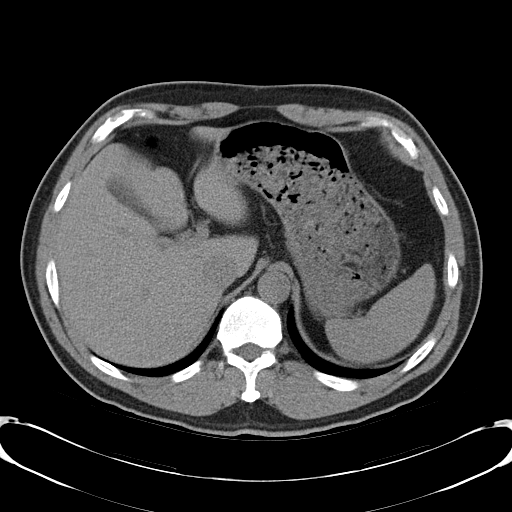
[im 88/94  soft-tissue]
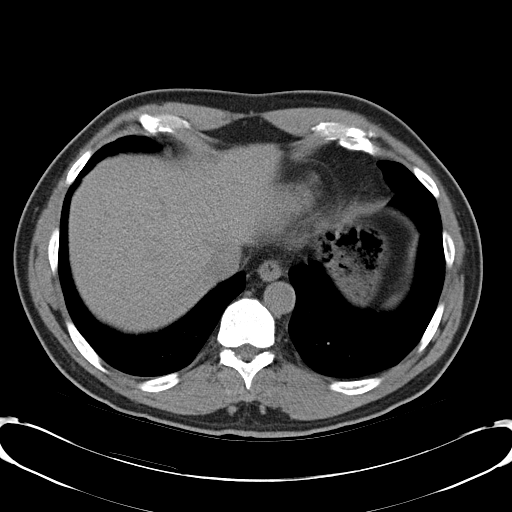

[Series 4: mpr coronal · coronal · 0.73mm/px · 3 of 85 slices shown]
[im 29/85  soft-tissue]
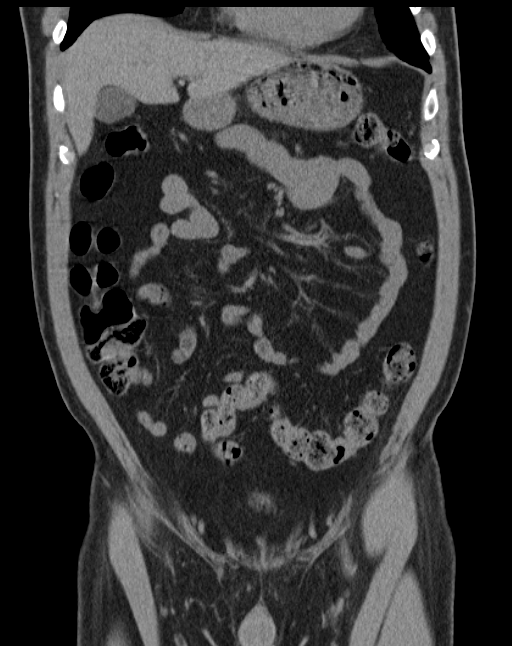
[im 38/85  soft-tissue]
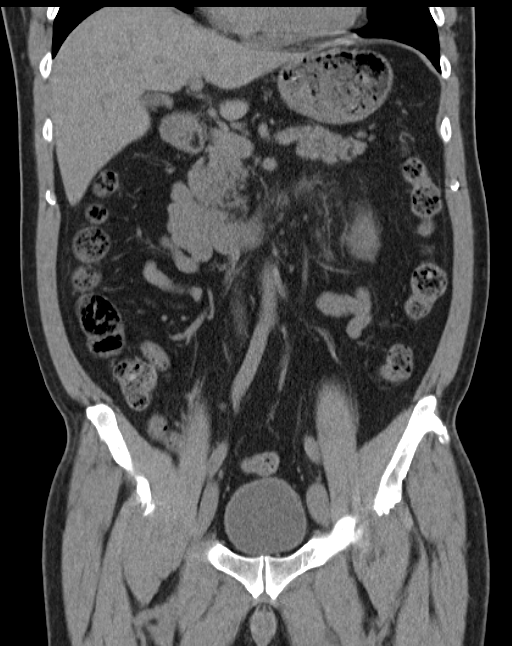
[im 47/85  soft-tissue]
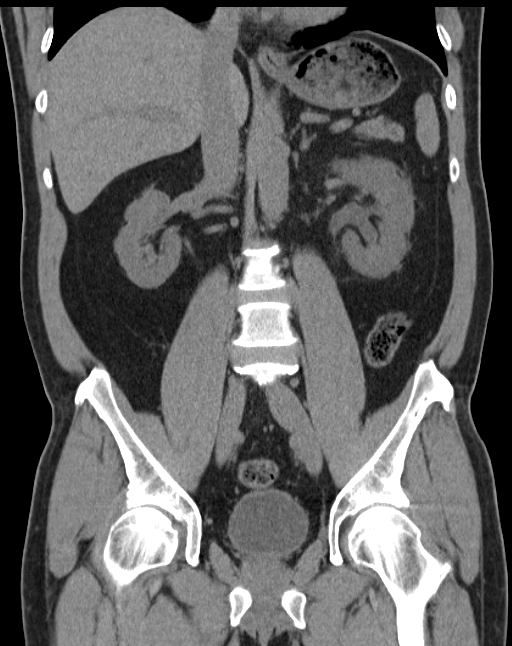

[17 of 46 positions shown; findings below may reference images not displayed]

FINDINGS: Lung bases are clear. Liver gallbladder and bile ducts are normal.
Pancreas and spleen are normal.

7 mm stone in the proximal left ureter causing moderate obstruction
of left kidney with hydronephrosis and perinephric edema. 2 mm
nonobstructing stone left upper pole. No calculi on the right. No
renal mass.

Negative for bowel obstruction. Appendix normal. No free fluid.
Negative for mass or adenopathy.
IMPRESSION: 7 mm obstructing stone proximal left ureter. 2 mm nonobstructing
left upper pole calculus. No calculi on the right.

## 2020-01-18 ENCOUNTER — Ambulatory Visit
Admission: EM | Admit: 2020-01-18 | Discharge: 2020-01-18 | Disposition: A | Discharge status: Home / Self Care | Payer: Medicare Other

## 2020-01-18 ENCOUNTER — Other Ambulatory Visit: Payer: Self-pay

## 2020-01-18 DIAGNOSIS — J069 Acute upper respiratory infection, unspecified: Secondary | ICD-10-CM

## 2020-01-18 DIAGNOSIS — J209 Acute bronchitis, unspecified: Secondary | ICD-10-CM

## 2020-01-18 MED ORDER — PREDNISONE 10 MG (21) PO TBPK: ORAL_TABLET | Freq: Every day | ORAL | 0 refills | Status: DC

## 2020-01-18 MED ORDER — BENZONATATE 100 MG PO CAPS: 100.0000 mg | ORAL_CAPSULE | Freq: Three times a day (TID) | ORAL | 0 refills | Status: DC

## 2020-01-18 MED ORDER — ALBUTEROL SULFATE (2.5 MG/3ML) 0.083% IN NEBU
2.5000 mg | INHALATION_SOLUTION | RESPIRATORY_TRACT | Status: DC | PRN
  Administered 2020-01-18: 2.5 mg via RESPIRATORY_TRACT

## 2020-01-18 MED ORDER — AZITHROMYCIN 250 MG PO TABS: 250.0000 mg | ORAL_TABLET | Freq: Every day | ORAL | 0 refills | Status: DC

## 2020-01-18 NOTE — Discharge Instructions (Signed)
Get plenty of rest and push fluids Albuterol inhaler given in office.  Use as needed for shortness of breath and/or wheezing Prednisone prescribed for bronchitis Tessalon Perles prescribed for cough Azithromycin prescribed.  Fill in 2-3 days if symptoms not improving with steroid and inhaler Use OTC zyrtec for nasal congestion, runny nose, and/or sore throat Use OTC flonase for nasal congestion and runny nose Use medications daily for symptom relief Use OTC medications like ibuprofen or tylenol as needed fever or pain Call or go to the ED if you have any new or worsening symptoms such as fever, worsening cough, shortness of breath, chest tightness, chest pain, turning blue, changes in mental status, etc..Marland Kitchen

## 2020-01-18 NOTE — ED Provider Notes (Signed)
[place]   [id number] 01/18/20 Arrival Time: 0935   CC: Cough  SUBJECTIVE: History from: patient.  AXYL SITZMAN is a 64 y.o. male who presents with drainage, runny nose, congestion x 6 days and dry cough with wheezes x 2 days.  Denies sick exposure to COVID, flu or strep.  Admits to recent travel.  Has tried OTC mediations without relief.  Symptoms are made worse over time.  Reports previous symptoms in the past with bronchitis.   Denies fever, chills, fatigue, sore throat, chest pain, nausea, changes in bowel or bladder habits.     ROS: As per HPI.  All other pertinent ROS negative.     Past Medical History:  Diagnosis Date  . Headache(784.0)    migrain stoped at age 63  . Hypertension   . Hypothyroidism   . Kidney stones   . Neuropathy associated with olivopontocerebellar degeneration [facility])    Past Surgical History:  Procedure Laterality Date  . CERVICAL SPINE SURGERY    . MANDIBLE FRACTURE SURGERY    . NASAL SINUS SURGERY  1982   Allergies  Allergen Reactions  . Amoxicillin    No current facility-administered medications on file prior to encounter.   Current Outpatient Medications on File Prior to Encounter  Medication Sig Dispense Refill  . amLODipine (NORVASC) 5 MG tablet Take 5 mg by mouth at bedtime.      . hydrochlorothiazide (HYDRODIURIL) 12.5 MG tablet Take 12.5 mg by mouth daily.    Marland Kitchen levothyroxine (SYNTHROID, LEVOTHROID) 75 MCG tablet Take 75 mcg by mouth at bedtime.      . nortriptyline (PAMELOR) 25 MG capsule Take 25 mg by mouth at bedtime.    . tamsulosin (FLOMAX) 0.4 MG CAPS capsule Take 0.4 mg by mouth daily.    . traMADol (ULTRAM) 50 MG tablet Take 50 mg by mouth every 6 (six) hours as needed (Pain).    . [DISCONTINUED] pregabalin (LYRICA) 75 MG capsule Take 75 mg by mouth at bedtime.      Social History   Socioeconomic History  . Marital status: Married    Spouse name: Not on file  . Number of children: Not on file  . Years of  education: Not on file  . Highest education level: Not on file  Occupational History  . Not on file  Tobacco Use  . Smoking status: Never Smoker  . Smokeless tobacco: Never Used  Substance and Sexual Activity  . Alcohol use: Yes    Comment: occasionally  . Drug use: No  . Sexual activity: Yes  Other Topics Concern  . Not on file  Social History Narrative  . Not on file   Social Determinants of Health   Financial Resource Strain:   . Difficulty of Paying Living Expenses:   Food Insecurity:   . Worried About Charity fundraiser in the Last Year:   . Arboriculturist in the Last Year:   Transportation Needs:   . Film/video editor (Medical):   Marland Kitchen Lack of Transportation (Non-Medical):   Physical Activity:   . Days of Exercise per Week:   . Minutes of Exercise per Session:   Stress:   . Feeling of Stress :   Social Connections:   . Frequency of Communication with Friends and Family:   . Frequency of Social Gatherings with Friends and Family:   . Attends Religious Services:   . Active Member of Clubs or Organizations:   . Attends Archivist  Meetings:   Marland Kitchen Marital Status:   Intimate Partner Violence:   . Fear of Current or Ex-Partner:   . Emotionally Abused:   Marland Kitchen Physically Abused:   . Sexually Abused:    History reviewed. No pertinent family history.  OBJECTIVE:  Vitals:   01/18/20 0945  BP: 113/77  Pulse: 99  Resp: 16  Temp: 98.3 F (36.8 C)  TempSrc: Oral  SpO2: 96%     General appearance: alert; appears fatigued, but nontoxic; speaking in full sentences and tolerating own secretions HEENT: NCAT; Ears: EACs clear, TMs pearly gray; Eyes: PERRL.  EOM grossly intact. Nose: nares patent without rhinorrhea, Throat: oropharynx clear, tonsils non erythematous or enlarged, uvula midline  Neck: supple without LAD Lungs: unlabored respirations, symmetrical air entry; cough: moderate; no respiratory distress; CTAB Heart: regular rate and rhythm.   Skin:  warm and dry Psychological: alert and cooperative; normal mood and affect   ASSESSMENT & PLAN:  1. Viral URI with cough   2. Acute bronchitis, unspecified organism     Meds ordered this encounter  Medications  . predniSONE (STERAPRED UNI-PAK 21 TAB) 10 MG (21) TBPK tablet    Sig: Take by mouth daily. Take 6 tabs by mouth daily  for 2 days, then 5 tabs for 2 days, then 4 tabs for 2 days, then 3 tabs for 2 days, 2 tabs for 2 days, then 1 tab by mouth daily for 2 days    Dispense:  42 tablet    Refill:  0    Order Specific Question:   Supervising Provider    Answer:   Raylene Everts [[id number]]  . benzonatate (TESSALON) 100 MG capsule    Sig: Take 1 capsule (100 mg total) by mouth every 8 (eight) hours.    Dispense:  21 capsule    Refill:  0    Order Specific Question:   Supervising Provider    Answer:   Raylene Everts [[id number]]  . azithromycin (ZITHROMAX) 250 MG tablet    Sig: Take 1 tablet (250 mg total) by mouth daily. Take first 2 tablets together, then 1 every day until finished.    Dispense:  6 tablet    Refill:  0    Order Specific Question:   Supervising Provider    Answer:   Raylene Everts [[id number]]  . albuterol (PROVENTIL) (2.5 MG/3ML) 0.083% nebulizer solution 2.5 mg    Get plenty of rest and push fluids Albuterol inhaler given in office.  Use as needed for shortness of breath and/or wheezing Prednisone prescribed for bronchitis Tessalon Perles prescribed for cough Azithromycin prescribed.  Fill in 2-3 days if symptoms not improving with steroid and inhaler Use OTC zyrtec for nasal congestion, runny nose, and/or sore throat Use OTC flonase for nasal congestion and runny nose Use medications daily for symptom relief Use OTC medications like ibuprofen or tylenol as needed fever or pain Call or go to the ED if you have any new or worsening symptoms such as fever, worsening cough, shortness of breath, chest tightness, chest pain, turning blue, changes in mental  status, etc...   Reviewed expectations re: course of current medical issues. Questions answered. Outlined signs and symptoms indicating need for more acute intervention. Patient verbalized understanding. After Visit Summary given.         Lestine Box, PA-C 01/18/20 1003

## 2020-01-18 NOTE — ED Triage Notes (Signed)
Pt presents with cough for past few days, believes he has bronchitis

## 2020-08-30 ENCOUNTER — Emergency Department (HOSPITAL_COMMUNITY): Payer: Medicare Other

## 2020-08-30 ENCOUNTER — Other Ambulatory Visit: Payer: Self-pay

## 2020-08-30 ENCOUNTER — Encounter: Payer: Self-pay | Admitting: Emergency Medicine

## 2020-08-30 ENCOUNTER — Emergency Department (HOSPITAL_COMMUNITY)
Admission: EM | Admit: 2020-08-30 | Discharge: 2020-08-30 | Disposition: A | Discharge status: Home / Self Care | Payer: Medicare Other | Attending: Emergency Medicine | Admitting: Emergency Medicine

## 2020-08-30 ENCOUNTER — Encounter (HOSPITAL_COMMUNITY): Payer: Self-pay | Admitting: *Deleted

## 2020-08-30 ENCOUNTER — Ambulatory Visit
Admission: EM | Admit: 2020-08-30 | Discharge: 2020-08-30 | Payer: Medicare Other | Attending: Emergency Medicine | Admitting: Emergency Medicine

## 2020-08-30 DIAGNOSIS — E039 Hypothyroidism, unspecified: Secondary | ICD-10-CM | POA: Insufficient documentation

## 2020-08-30 DIAGNOSIS — R519 Headache, unspecified: Secondary | ICD-10-CM | POA: Diagnosis not present

## 2020-08-30 DIAGNOSIS — R079 Chest pain, unspecified: Secondary | ICD-10-CM | POA: Diagnosis present

## 2020-08-30 DIAGNOSIS — Z1152 Encounter for screening for COVID-19: Secondary | ICD-10-CM | POA: Diagnosis not present

## 2020-08-30 DIAGNOSIS — R0789 Other chest pain: Secondary | ICD-10-CM | POA: Diagnosis not present

## 2020-08-30 DIAGNOSIS — I10 Essential (primary) hypertension: Secondary | ICD-10-CM | POA: Diagnosis not present

## 2020-08-30 DIAGNOSIS — Z79899 Other long term (current) drug therapy: Secondary | ICD-10-CM | POA: Insufficient documentation

## 2020-08-30 DIAGNOSIS — Z20822 Contact with and (suspected) exposure to covid-19: Secondary | ICD-10-CM | POA: Insufficient documentation

## 2020-08-30 LAB — CBC
HCT: 54.9 % — ABNORMAL HIGH (ref 39.0–52.0)
Hemoglobin: 18.3 g/dL — ABNORMAL HIGH (ref 13.0–17.0)
MCH: 30.9 pg (ref 26.0–34.0)
MCHC: 33.3 g/dL (ref 30.0–36.0)
MCV: 92.6 fL (ref 80.0–100.0)
Platelets: 197 10*3/uL (ref 150–400)
RBC: 5.93 MIL/uL — ABNORMAL HIGH (ref 4.22–5.81)
RDW: 12.8 % (ref 11.5–15.5)
WBC: 9.1 10*3/uL (ref 4.0–10.5)
nRBC: 0 % (ref 0.0–0.2)

## 2020-08-30 LAB — BASIC METABOLIC PANEL
Anion gap: 8 (ref 5–15)
BUN: 19 mg/dL (ref 8–23)
CO2: 28 mmol/L (ref 22–32)
Calcium: 9.3 mg/dL (ref 8.9–10.3)
Chloride: 104 mmol/L (ref 98–111)
Creatinine, Ser: 1.42 mg/dL — ABNORMAL HIGH (ref 0.61–1.24)
GFR, Estimated: 55 mL/min — ABNORMAL LOW (ref >60)
Glucose, Bld: 97 mg/dL (ref 70–99)
Potassium: 4.4 mmol/L (ref 3.5–5.1)
Sodium: 140 mmol/L (ref 135–145)

## 2020-08-30 LAB — TROPONIN I (HIGH SENSITIVITY)
Troponin I (High Sensitivity): <2 ng/L (ref <18)
Troponin I (High Sensitivity): <2 ng/L (ref <18)

## 2020-08-30 LAB — POC SARS CORONAVIRUS 2 AG -  ED: SARS Coronavirus 2 Ag: NEGATIVE

## 2020-08-30 MED ORDER — ASPIRIN 81 MG PO CHEW: 324.0000 mg | CHEWABLE_TABLET | Freq: Once | ORAL | Status: DC

## 2020-08-30 NOTE — ED Provider Notes (Addendum)
[place]   [id number] 08/30/20 Arrival Time: 1023   CC: CHEST PAIN  SUBJECTIVE:  Marc [name] is a 65 y.o. male who presented to the urgent care with a complaint of chest pain since Friday  for the past few days. States pain radiated to left shoulder today.  Denies a precipitating event, trauma, or strenuous upper body activities.  Localizes chest pain to the left substernal region and left shoulder.  Describes as achy, that is intermittent  in character.  Rates pain as 4/10.   Has tried not tried any OTC medication. Denies alleviating or aggravating factors. Denies previous symptoms in the past.  Denies fever, chills, lightheadedness, dizziness, palpitations, tachycardia, SOB, nausea, vomiting, abdominal pain, changes in bowel or bladder habits, diaphoresis, numbness/tingling in extremities, peripheral edema, or anxiety.     You would like to have COVID-19 test completed as well at this visit.  Denies close relatives with cardiac hx  Previous cardiac testing: none.  ROS: As per HPI.  All other pertinent ROS negative.    Past Medical History:  Diagnosis Date  . Headache(784.0)    migrain stoped at age 77  . Hypertension   . Hypothyroidism   . Kidney stones   . 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 CERVICAL SPINE SURGERY    . MANDIBLE FRACTURE SURGERY    . NASAL SINUS SURGERY  1982   Allergies  Allergen Reactions  . Amoxicillin    No current facility-administered medications on file prior to encounter.   Current Outpatient Medications on File Prior to Encounter  Medication Sig Dispense Refill  . amLODipine (NORVASC) 5 MG tablet Take 5 mg by mouth at bedtime.      Marland Kitchen azithromycin (ZITHROMAX) 250 MG tablet Take 1 tablet (250 mg total) by mouth daily. Take first 2 tablets together, then 1 every day until finished. 6 tablet 0  . benzonatate (TESSALON) 100 MG capsule Take 1  capsule (100 mg total) by mouth every 8 (eight) hours. 21 capsule 0  . hydrochlorothiazide (HYDRODIURIL) 12.5 MG tablet Take 12.5 mg by mouth daily.    Marland Kitchen levothyroxine (SYNTHROID, LEVOTHROID) 75 MCG tablet Take 75 mcg by mouth at bedtime.      . nortriptyline (PAMELOR) 25 MG capsule Take 25 mg by mouth at bedtime.    . predniSONE (STERAPRED UNI-PAK 21 TAB) 10 MG (21) TBPK tablet Take by mouth daily. Take 6 tabs by mouth daily  for 2 days, then 5 tabs for 2 days, then 4 tabs for 2 days, then 3 tabs for 2 days, 2 tabs for 2 days, then 1 tab by mouth daily for 2 days 42 tablet 0  . tamsulosin (FLOMAX) 0.4 MG CAPS capsule Take 0.4 mg by mouth daily.    . traMADol (ULTRAM) 50 MG tablet Take 50 mg by mouth every 6 (six) hours as needed (Pain).    . [DISCONTINUED] pregabalin (LYRICA) 75 MG capsule Take 75 mg by mouth at bedtime.      Social History   Socioeconomic History  . Marital status: Married    Spouse name: Not on file  . Number of children: Not on file  . Years of education: Not on file  . Highest education level: Not on file  Occupational History  . Not on file  Tobacco Use  . Smoking status: Never Smoker  . Smokeless tobacco: Never Used  Substance and Sexual Activity  . Alcohol use:  Yes    Comment: occasionally  . Drug use: No  . Sexual activity: Yes  Other Topics Concern  . Not on file  Social History Narrative  . Not on file   Social Determinants of Health   Financial Resource Strain: Not on file  Food Insecurity: Not on file  Transportation Needs: Not on file  Physical Activity: Not on file  Stress: Not on file  Social Connections: Not on file  Intimate Partner Violence: Not on file   No family history on file.   OBJECTIVE:  Vitals:   08/30/20 1035  BP: (!) 142/78  Pulse: 94  Resp: 18  Temp: 97.7 F (36.5 C)  TempSrc: Oral  SpO2: 95%  Weight: 182 lb (82.6 kg)  Height: 5\' 10"  (1.778 m)    General appearance: alert; no distress Eyes: PERRLA; EOMI;  conjunctiva normal HENT: normocephalic; atraumatic Neck: supple Lungs: clear to auscultation bilaterally without adventitious breath sounds Heart: regular rate and rhythm.  Clear S1 and S2 without rubs, gallops, or murmur. Chest Wall:  no thrills Abdomen: soft, non-tender; bowel sounds normal; no masses or organomegaly; no guarding or rebound tenderness Extremities: no cyanosis or edema; symmetrical with no gross deformities Skin: warm and dry Psychological: alert and cooperative; normal mood and affect  ECG: No orders found for this or any previous visit.  EKG normal sinus rhythm with left fascicular block and incomplete right BBB. Reviewed EKG with Dr Lynelle Doctor who recommends sending patient to the ER.  LABS:  Results for orders placed or performed during the hospital encounter of 10/16/13  Urinalysis, Routine w reflex microscopic  Result Value Ref Range   Color, Urine YELLOW YELLOW   APPearance CLEAR CLEAR   Specific Gravity, Urine >1.030 (H) 1.005 - 1.030   pH 5.5 5.0 - 8.0   Glucose, UA NEGATIVE NEGATIVE mg/dL   Hgb urine dipstick LARGE (A) NEGATIVE   Bilirubin Urine NEGATIVE NEGATIVE   Ketones, ur NEGATIVE NEGATIVE mg/dL   Protein, ur NEGATIVE NEGATIVE mg/dL   Urobilinogen, UA 0.2 0.0 - 1.0 mg/dL   Nitrite NEGATIVE NEGATIVE   Leukocytes, UA NEGATIVE NEGATIVE  Urine microscopic-add on  Result Value Ref Range   Squamous Epithelial / LPF RARE RARE   RBC / HPF 3-6 <3 RBC/hpf  CBC with Differential  Result Value Ref Range   WBC 15.1 (H) 4.0 - 10.5 K/uL   RBC 5.14 4.22 - 5.81 MIL/uL   Hemoglobin 15.7 13.0 - 17.0 g/dL   HCT 45.3 39.0 - 52.0 %   MCV 88.1 78.0 - 100.0 fL   MCH 30.5 26.0 - 34.0 pg   MCHC 34.7 30.0 - 36.0 g/dL   RDW 12.6 11.5 - 15.5 %   Platelets 155 150 - 400 K/uL   Neutrophils Relative % 89 (H) 43 - 77 %   Neutro Abs 13.4 (H) 1.7 - 7.7 K/uL   Lymphocytes Relative 5 (L) 12 - 46 %   Lymphs Abs 0.8 0.7 - 4.0 K/uL   Monocytes Relative 6 3 - 12 %   Monocytes  Absolute 0.9 0.1 - 1.0 K/uL   Eosinophils Relative 0 0 - 5 %   Eosinophils Absolute 0.0 0.0 - 0.7 K/uL   Basophils Relative 0 0 - 1 %   Basophils Absolute 0.0 0.0 - 0.1 K/uL  Basic metabolic panel  Result Value Ref Range   Sodium 143 137 - 147 mEq/L   Potassium 4.3 3.7 - 5.3 mEq/L   Chloride 106 96 - 112 mEq/L  CO2 26 19 - 32 mEq/L   Glucose, Bld 146 (H) 70 - 99 mg/dL   BUN 15 6 - 23 mg/dL   Creatinine, Ser 1.47 (H) 0.50 - 1.35 mg/dL   Calcium 8.7 8.4 - 10.5 mg/dL   GFR calc non Af Amer 50 (L) >90 mL/min   GFR calc Af Amer 59 (L) >90 mL/min   Labs Reviewed  COVID-19, FLU A+B NAA    DIAGNOSTIC STUDIES:  No results found.   ASSESSMENT & PLAN:  1. Other chest pain   2. Encounter for screening for COVID-19     No orders of the defined types were placed in this encounter.  EKG reviewed with Dr. Lynelle Doctor who recommend patient to go to ER for further evaluation to rule out MI.  Was given option top go via ambulance, however patient had to declined.  Discharge instructions  Chest pain precautions given. Please go to ER for further evaluation  Reviewed expectations re: course of current medical issues. Questions answered. Outlined signs and symptoms indicating need for more acute intervention. Patient verbalized understanding. After Visit Summary given.   Emerson Monte, FNP 08/30/20 1122    Emerson Monte, FNP 08/30/20 1123    Emerson Monte, FNP 08/30/20 1125

## 2020-08-30 NOTE — ED Triage Notes (Signed)
Pain in left side of chest for 3 days

## 2020-08-30 NOTE — Discharge Instructions (Addendum)
Chest pain precautions given. Please go to ER for further evaluation

## 2020-08-30 NOTE — ED Triage Notes (Signed)
Also c/o headache and sinus drainage

## 2020-08-30 NOTE — ED Provider Notes (Signed)
[facility] EMERGENCY DEPARTMENT Provider Note   CSN: [id number] Arrival date & time: 08/30/20  1137     History Chief Complaint  Patient presents with  . Chest Pain    Marc [name] is a 65 y.o. male with PMHx HTN, hypothyroidism who presents to the ED today with complaint of gradual onset, constant, waxing and waning, dull/achy left sided chest pain that began 4 days ago. Pt reports the pain began Friday night while he was getting ready for bed. He did not think much of it however continued to have the pain throughout the next few days. He states that Thursday evening he began having some nausea however attributed it to the food that he ate. The nausea came again Saturday however does not currently feel nauseated. He denies the nausea being present when the pain is at its worst. Pt does mention he has also woken up with a headache for the past 3 mornings. He reports that it is a frontal headache; he has history of migraines in the past and states this feels much less severe and will go away with Advil. He is unsure if the headaches are caused by stress/worrying about his chest pain. The chest pain radiated into his left shoulder earlier today prompting him to attempt to see his PCP for further evaluation. They were unable to see him so he went to an UC for evaluation. Pt's EKG at urgent care had some abnormalities including an incomplete RBBB and a LAFB. He was advised to come to the ED for further evaluation given these abnormalities. Pt reports that the pain has currently subsided and is about a 1/10 on the pain scale. He denies any shortness of breath, diaphoresis, vomiting, leg swelling, or any other associated symptoms. Pt denies Fhx of CAD or personal history. 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 He has not noticed any exacerbating or alleviating factors to his chest pain. He has not taken  anything for the pain. No hx DVT/PE. No recent prolonged travel or immobilization. No hemoptysis. No active malignancy. No exogenous hormone use.   The history is provided by the patient and medical records.       Past Medical History:  Diagnosis Date  . Headache(784.0)    migrain stoped at age 1  . Hypertension   . Hypothyroidism   . Kidney stones   . Neuropathy associated with olivopontocerebellar degeneration [facility])     Patient Active Problem List   Diagnosis Date Noted  . Ureteral calculus 10/20/2013  . NEOPLASMS UNSPEC NATURE BONE SOFT TISSUE&SKIN 04/14/2008  . LATERAL EPICONDYLITIS 04/06/2008    Past Surgical History:  Procedure Laterality Date  . CERVICAL SPINE SURGERY    . MANDIBLE FRACTURE SURGERY    . NASAL SINUS SURGERY  1982       No family history on file.  Social History   Tobacco Use  . Smoking status: Never Smoker  . Smokeless tobacco: Never Used  Substance Use Topics  . Alcohol use: Yes    Comment: occasionally  . Drug use: No    Home Medications Prior to Admission medications   Medication Sig Start Date End Date Taking? Authorizing Provider  amLODipine (NORVASC) 5 MG tablet Take 5 mg by mouth at bedtime.      [provider]  azithromycin (ZITHROMAX) 250 MG tablet Take 1 tablet (250 mg total) by mouth  daily. Take first 2 tablets together, then 1 every day until finished. 01/18/20   Wurst, [place], PA-C  benzonatate (TESSALON) 100 MG capsule Take 1 capsule (100 mg total) by mouth every 8 (eight) hours. 01/18/20   Wurst, [place], PA-C  hydrochlorothiazide (HYDRODIURIL) 12.5 MG tablet Take 12.5 mg by mouth daily. 11/02/19   [provider]  levothyroxine (SYNTHROID, LEVOTHROID) 75 MCG tablet Take 75 mcg by mouth at bedtime.      [provider]  nortriptyline (PAMELOR) 25 MG capsule Take 25 mg by mouth at bedtime. 11/05/19   [provider]  predniSONE (STERAPRED UNI-PAK 21 TAB) 10 MG (21) TBPK tablet Take by  mouth daily. Take 6 tabs by mouth daily  for 2 days, then 5 tabs for 2 days, then 4 tabs for 2 days, then 3 tabs for 2 days, 2 tabs for 2 days, then 1 tab by mouth daily for 2 days 01/18/20   Stacey Drain, [place], PA-C  tamsulosin (FLOMAX) 0.4 MG CAPS capsule Take 0.4 mg by mouth daily. 11/29/19   [provider]  traMADol (ULTRAM) 50 MG tablet Take 50 mg by mouth every 6 (six) hours as needed (Pain).    [provider]  pregabalin (LYRICA) 75 MG capsule Take 75 mg by mouth at bedtime.   01/18/20  [provider]    Allergies    Amoxicillin  Review of Systems   Review of Systems  Constitutional: Negative for chills and fever.  Eyes: Negative for visual disturbance.  Respiratory: Negative for cough and shortness of breath.   Cardiovascular: Positive for chest pain. Negative for palpitations and leg swelling.  Gastrointestinal: Positive for nausea. Negative for abdominal pain, diarrhea and vomiting.  Musculoskeletal: Negative for myalgias.  Neurological: Positive for headaches (none currently).  All other systems reviewed and are negative.   Physical Exam Updated Vital Signs BP (!) 141/93 (BP Location: Right Arm)   Pulse 87   Temp 97.7 F (36.5 C) (Oral)   Resp 18   Ht 5\' 10"  (1.778 m)   Wt 82.6 kg   SpO2 100%   BMI 26.11 kg/m   Physical Exam Vitals and nursing note reviewed.  Constitutional:      Appearance: He is not ill-appearing or diaphoretic.  HENT:     Head: Normocephalic and atraumatic.  Eyes:     Conjunctiva/sclera: Conjunctivae normal.  Cardiovascular:     Rate and Rhythm: Normal rate and regular rhythm.     Pulses:          Radial pulses are 2+ on the right side and 2+ on the left side.       Dorsalis pedis pulses are 2+ on the right side and 2+ on the left side.  Pulmonary:     Effort: Pulmonary effort is normal.     Breath sounds: Normal breath sounds. No decreased breath sounds, wheezing, rhonchi or rales.  Chest:     Chest wall: No  tenderness.  Abdominal:     Palpations: Abdomen is soft.     Tenderness: There is no abdominal tenderness. There is no guarding or rebound.  Musculoskeletal:     Cervical back: Neck supple.     Right lower leg: No tenderness. No edema.     Left lower leg: No tenderness. No edema.  Skin:    General: Skin is warm and dry.  Neurological:     Mental Status: He is alert.     ED Results / Procedures / Treatments   Labs (  all labs ordered are listed, but only abnormal results are displayed) Labs Reviewed  BASIC METABOLIC PANEL - Abnormal; Notable for the following components:      Result Value   Creatinine, Ser 1.42 (*)    GFR, Estimated 55 (*)    All other components within normal limits  CBC - Abnormal; Notable for the following components:   RBC 5.93 (*)    Hemoglobin 18.3 (*)    HCT 54.9 (*)    All other components within normal limits  POC SARS CORONAVIRUS 2 AG -  ED - Normal  TROPONIN I (HIGH SENSITIVITY)  TROPONIN I (HIGH SENSITIVITY)    EKG EKG Interpretation  Date/Time:  Monday August 30 2020 11:41:38 EST Ventricular Rate:  87 PR Interval:  150 QRS Duration: 86 QT Interval:  358 QTC Calculation: 430 R Axis:   -88 Text Interpretation: Normal sinus rhythm Left anterior fascicular block Abnormal ECG No STEMI Confirmed by Nanda Quinton [phone]) on 08/30/2020 4:23:28 PM   Radiology DG Chest 2 View  Result Date: 08/30/2020 CLINICAL DATA:  Chest pain EXAM: CHEST - 2 VIEW COMPARISON:  October 06, 2010. FINDINGS: Lungs are clear. Heart size and pulmonary vascularity are normal. No adenopathy. No pneumothorax. There is degenerative change in each shoulder. IMPRESSION: Lungs clear.  Cardiac silhouette normal. Electronically Signed   By: Lowella Grip III M.D.   On: 08/30/2020 12:27    Procedures Procedures   Medications Ordered in ED Medications - No data to display  ED Course  I have reviewed the triage vital signs and the nursing notes.  Pertinent labs & imaging  results that were available during my care of the patient were reviewed by me and considered in my medical decision making (see chart for details).    MDM Rules/Calculators/A&P                          65 year old male who presents to the ED today with persistent dull/achy chest pain on his left side radiating into his left shoulder that began 4 days ago.  Went to urgent care and was sent here after he had some abnormalities on his EKG including an incomplete right bundle branch block and left anterior fascicular block.  On arrival to the ED vitals are stable.  Patient is afebrile, nontachycardic and nontachypneic and appears to be in no acute distress.  An EKG was obtained while patient was in the waiting room which is unchanged from a previous EKG in 2012; confirmed by attending physician Dr. Laverta Baltimore. His chest x-ray was clear in the waiting room and initial troponin was less than 2.  He currently has a repeat troponin pending.  Remainder of labs unremarkable and creatinine stable at 1.42.  On exam patient has equal pulses bilaterally.  He has no obvious chest wall tenderness palpation.  He denies any shortness of breath and is able speak in full sentences without difficulty.  No other associated symptoms including diaphoresis, shortness of breath, vomiting, leg swelling.  Has had some nausea for the past several days as well as an intermittent headache, nonsevere with history of worse migraines in the past.  No headache currently.  Patient did have a Covid test while at urgent care which is currently pending.  Only risk factor for ACS at this time is high blood pressure.  He has no risk factors concerning for PE.  I have very low suspicion for dissection given the overall well appearance of the  patient.  We will plan to wait for repeat troponin and if negative we will plan to discharge home with cardiology follow-up.  Will place an ambulatory referral.  We will also obtain point-of-care Covid test at this  time, question of his headaches, nausea, chest pain related to Covid versus other abnormality.   Repeat troponin has returned less than 2.  Covid test has returned negative.  I do not feel patient needs additional intervention at this time.  Will place ambulatory referral and have patient follow-up with cardiology for further evaluation.  May benefit from stress test. I have discussed this with pt and his wife; they have also been updated on strict return precautions. They are in agreement with plan and stable for discharge home.    This note was prepared using Dragon voice recognition software and may include unintentional dictation errors due to the inherent limitations of voice recognition software.   Final Clinical Impression(s) / ED Diagnoses Final diagnoses:  Nonspecific chest pain    Rx / DC Orders ED Discharge Orders         Ordered    Ambulatory referral to Cardiology        08/30/20 1731           Discharge Instructions     We have placed an ambulatory referral to cardiology for you - they should call you to schedule an appointment. If you have not heard by Thursday/Friday I would recommend you call their office to schedule a follow up appointment.   Take Ibuprofen as needed for the chest pain as your pain may be musculoskeletal in nature.   It is recommended that you self isolate until you receive your COVID 19 test results from urgent care - they will call you if you test positive.   Return to the ED IMMEDIATELY for any worsening symptoms including worsening pain, shortness of breath, vomiting, excessive sweating/clamminess with the pain, passing out, or any other new/concerning symptoms.        Eustaquio Maize, PA-C 08/30/20 1734    Long, Wonda [facility], MD 08/31/20 [phone]

## 2020-08-30 NOTE — ED Triage Notes (Signed)
Chest pain since Friday night, radiates to left shoulder

## 2020-08-30 NOTE — ED Notes (Signed)
Report called to Apolonio Schneiders at [facility] ED

## 2020-08-30 NOTE — Discharge Instructions (Addendum)
We have placed an ambulatory referral to cardiology for you - they should call you to schedule an appointment. If you have not heard by Thursday/Friday I would recommend you call their office to schedule a follow up appointment.   Take Ibuprofen as needed for the chest pain as your pain may be musculoskeletal in nature.   It is recommended that you self isolate until you receive your COVID 19 test results from urgent care - they will call you if you test positive.   Return to the ED IMMEDIATELY for any worsening symptoms including worsening pain, shortness of breath, vomiting, excessive sweating/clamminess with the pain, passing out, or any other new/concerning symptoms.

## 2020-08-30 NOTE — ED Notes (Signed)

## 2020-09-01 ENCOUNTER — Other Ambulatory Visit: Payer: Self-pay

## 2020-09-01 ENCOUNTER — Encounter: Payer: Self-pay | Admitting: Cardiovascular Disease

## 2020-09-01 ENCOUNTER — Ambulatory Visit (INDEPENDENT_AMBULATORY_CARE_PROVIDER_SITE_OTHER): Payer: Medicare Other | Admitting: Cardiovascular Disease

## 2020-09-01 DIAGNOSIS — R072 Precordial pain: Secondary | ICD-10-CM

## 2020-09-01 DIAGNOSIS — I10 Essential (primary) hypertension: Secondary | ICD-10-CM | POA: Diagnosis not present

## 2020-09-01 DIAGNOSIS — R0789 Other chest pain: Secondary | ICD-10-CM | POA: Diagnosis not present

## 2020-09-01 DIAGNOSIS — R079 Chest pain, unspecified: Secondary | ICD-10-CM

## 2020-09-01 LAB — COVID-19, FLU A+B NAA
Influenza A, NAA: NOT DETECTED
Influenza B, NAA: NOT DETECTED
SARS-CoV-2, NAA: NOT DETECTED

## 2020-09-01 MED ORDER — METOPROLOL TARTRATE 100 MG PO TABS: 100.0000 mg | ORAL_TABLET | Freq: Once | ORAL | 0 refills | Status: AC

## 2020-09-01 NOTE — Assessment & Plan Note (Signed)
Marc [name] was referred for atypical chest arm and back pain.  He has a history of cervical radiculopathy C5/6 status post surgery in 2000 by Dr. Ashok Pall.  He was recently seen in urgent care and sent to the ER with pain that was involving his upper left chest, left shoulder, fifth fingers and radiation to the scapula.  The pain was constant for 4 days, somewhat improved with nonsteroidals.  There were no other associated symptoms.  He denied shortness of breath although there was some nausea probably pain related.  His EKG showed no acute changes.  Enzymes were negative.  His pain sound neurogenic and radiculopathic.  I am going get a coronary CTA to rule out CAD although I think this is going to end up being negative.  He will need further work-up including MRI of his neck and referral back to his neurosurgeon.

## 2020-09-01 NOTE — Assessment & Plan Note (Signed)
History of essential hypertension with blood pressure measured today at 120/76.  He is on low-dose amlodipine, and HydroDIURIL.

## 2020-09-01 NOTE — Progress Notes (Signed)
09/01/2020 Marc [name]   July 02, 1956  [id number]  Primary Physician Practice, [facility] Primary Cardiologist: Lorretta Harp MD FACP, [place], [place], [place]  HPI:  Marc [name] is a 65 y.o. mildly overweight married Caucasian male with no children who is accompanied by his wife Marc [name] left known for many years.  He is retired from working at the [employer] for 35 years.  His only risk factor is treated hypertension.  There is no family history.  Is never smoked.  Never had a heart attack or stroke.  He did have cervical disc disease involving C 5 and 6 back in 2000 and had surgical decompression by Dr. Ashok Pall.  He developed left inframammary pain with radiation to his left shoulder, left fourth and fifth fingers and scapula and was seen in an urgent care, referred to the ER where EKG showed no acute changes and enzymes were negative.  He has been on nonsteroidals with mild improvement.  The symptoms do not sound ischemic.   Current Meds  Medication Sig  . amLODipine (NORVASC) 5 MG tablet Take 5 mg by mouth at bedtime.  . clonazePAM (KLONOPIN) 1 MG tablet Take 1 mg by mouth at bedtime as needed.  . hydrochlorothiazide (HYDRODIURIL) 12.5 MG tablet Take 12.5 mg by mouth daily.  Marland Kitchen levothyroxine (SYNTHROID, LEVOTHROID) 75 MCG tablet Take 75 mcg by mouth at bedtime.  . nortriptyline (PAMELOR) 25 MG capsule Take 25 mg by mouth at bedtime.  . tamsulosin (FLOMAX) 0.4 MG CAPS capsule Take 0.4 mg by mouth daily.  . [DISCONTINUED] traMADol (ULTRAM) 50 MG tablet Take 50 mg by mouth every 6 (six) hours as needed (Pain).     Allergies  Allergen Reactions  . Amoxicillin     Social History   Socioeconomic History  . Marital status: Married    Spouse name: Not on file  . Number of children: Not on file  . Years of education: Not on file  . Highest education level: Not on file  Occupational History  . Not on file  Tobacco Use  . Smoking status: Never Smoker   . Smokeless tobacco: Never Used  Substance and Sexual Activity  . Alcohol use: Yes    Comment: occasionally  . Drug use: No  . Sexual activity: Yes  Other Topics Concern  . Not on file  Social History Narrative  . Not on file   Social Determinants of Health   Financial Resource Strain: Not on file  Food Insecurity: Not on file  Transportation Needs: Not on file  Physical Activity: Not on file  Stress: Not on file  Social Connections: Not on file  Intimate Partner Violence: Not on file     Review of Systems: General: negative for chills, fever, night sweats or weight changes.  Cardiovascular: negative for chest pain, dyspnea on exertion, edema, orthopnea, palpitations, paroxysmal nocturnal dyspnea or shortness of breath Dermatological: negative for rash Respiratory: negative for cough or wheezing Urologic: negative for hematuria Abdominal: negative for nausea, vomiting, diarrhea, bright red blood per rectum, melena, or hematemesis Neurologic: negative for visual changes, syncope, or dizziness All other systems reviewed and are otherwise negative except as noted above.    Blood pressure 120/76, pulse 93, height 5\' 9"  (1.753 m), weight 187 lb 12.8 oz (85.2 kg).  General appearance: alert and no distress Neck: no adenopathy, no carotid bruit, no JVD, supple, symmetrical, trachea midline and thyroid not enlarged, symmetric, no tenderness/mass/nodules Lungs: clear to auscultation  bilaterally Heart: regular rate and rhythm, S1, S2 normal, no murmur, click, rub or gallop Extremities: extremities normal, atraumatic, no cyanosis or edema Pulses: 2+ and symmetric Skin: Skin color, texture, turgor normal. No rashes or lesions Neurologic: Alert and oriented X 3, normal strength and tone. Normal symmetric reflexes. Normal coordination and gait  EKG sinus rhythm at 93 with left axis deviation and incomplete right bundle branch block.  I personally reviewed this EKG.  ASSESSMENT AND  PLAN:   Essential hypertension History of essential hypertension with blood pressure measured today at 120/76.  He is on low-dose amlodipine, and HydroDIURIL.  Atypical chest pain Mr. Hemme was referred for atypical chest arm and back pain.  He has a history of cervical radiculopathy C5/6 status post surgery in 2000 by Dr. Ashok Pall.  He was recently seen in urgent care and sent to the ER with pain that was involving his upper left chest, left shoulder, fifth fingers and radiation to the scapula.  The pain was constant for 4 days, somewhat improved with nonsteroidals.  There were no other associated symptoms.  He denied shortness of breath although there was some nausea probably pain related.  His EKG showed no acute changes.  Enzymes were negative.  His pain sound neurogenic and radiculopathic.  I am going get a coronary CTA to rule out CAD although I think this is going to end up being negative.  He will need further work-up including MRI of his neck and referral back to his neurosurgeon.      Lorretta Harp MD FACP,FACC,FAHA, [facility] 09/01/2020 3:38 PM

## 2020-09-01 NOTE — Patient Instructions (Addendum)
Medication Instructions:  Your physician recommends that you continue on your current medications as directed. Please refer to the Current Medication list given to you today.  *If you need a refill on your cardiac medications before your next appointment, please call your pharmacy*   Lab Work: Your physician recommends that you return for lab work when CTA is scheduled BMET   If you have labs (blood work) drawn today and your tests are completely normal, you will receive your results only by: Marland Kitchen MyChart Message (if you have MyChart) OR . A paper copy in the mail If you have any lab test that is abnormal or we need to change your treatment, we will call you to review the results.   Testing/Procedures: Your cardiac CT will be scheduled at one of the below locations:   [facility] [address] [place], [place] [address] [phone]  If scheduled at [facility], please arrive at the [facility] main entrance of [facility] 30 minutes prior to test start time. Proceed to the [facility] Radiology Department (first floor) to check-in and test prep.   Please follow these instructions carefully (unless otherwise directed):  Hold all erectile dysfunction medications at least 3 days (72 hrs) prior to test.  On the Night Before the Test: . Be sure to Drink plenty of water. . Do not consume any caffeinated/decaffeinated beverages or chocolate 12 hours prior to your test. . Do not take any antihistamines 12 hours prior to your test.  On the Day of the Test: . Drink plenty of water. Do not drink any water within one hour of the test. . Do not eat any food 4 hours prior to the test. . You may take your regular medications prior to the test.  . Take metoprolol (Lopressor) two hours prior to test. . HOLD Furosemide/Hydrochlorothiazide morning of the test.         -Drink plenty of water       -Hold Furosemide/hydrochlorothiazide morning of the test        -Take metoprolol (Lopressor) 100mg  2 hours prior to test (if applicable).                       After the Test: . Drink plenty of water. . After receiving IV contrast, you may experience a mild flushed feeling. This is normal. . On occasion, you may experience a mild rash up to 24 hours after the test. This is not dangerous. If this occurs, you can take Benadryl 25 mg and increase your fluid intake. . If you experience trouble breathing, this can be serious. If it is severe call 911 IMMEDIATELY. If it is mild, please call our office. . If you take any of these medications: Glipizide/Metformin, Avandament, Glucavance, please do not take 48 hours after completing test unless otherwise instructed.   Once we have confirmed authorization from your insurance company, we will call you to set up a date and time for your test. Based on how quickly your insurance processes prior authorizations requests, please allow up to 4 weeks to be contacted for scheduling your Cardiac CT appointment. Be advised that routine Cardiac CT appointments could be scheduled as many as 8 weeks after your provider has ordered it.  For non-scheduling related questions, please contact the cardiac imaging nurse navigator should you have any questions/concerns: Marchia Bond, Cardiac Imaging Nurse Navigator Burley Saver, Interim Cardiac Imaging Nurse Navigator [name] [facility] Direct  Office Dial: [phone]   For scheduling needs, including cancellations and rescheduling, please call [place], [phone].    Follow-Up: At [facility], you and your health needs are our priority.  As part of our continuing mission to provide you with exceptional heart care, we have created designated Provider Care Teams.  These Care Teams include your primary Cardiologist (physician) and Advanced Practice Providers (APPs -  Physician Assistants and Nurse Practitioners) who all work together to provide you with the care  you need, when you need it.  We recommend signing up for the patient portal called "MyChart".  Sign up information is provided on this After Visit Summary.  MyChart is used to connect with patients for Virtual Visits (Telemedicine).  Patients are able to view lab/test results, encounter notes, upcoming appointments, etc.  Non-urgent messages can be sent to your provider as well.   To learn more about what you can do with MyChart, go to [email or web address].    Your next appointment:     Provider:   Quay Burow, MD
